# Patient Record
Sex: Male | Born: 1955 | Hispanic: Yes | Marital: Married | Smoking: Never smoker
Health system: Southern US, Community
[De-identification: ages and names within clinical notes are randomized; demographics above are authoritative.]

## PROBLEM LIST (undated history)

## (undated) DIAGNOSIS — C499 Malignant neoplasm of connective and soft tissue, unspecified: Secondary | ICD-10-CM

## (undated) DIAGNOSIS — I10 Essential (primary) hypertension: Secondary | ICD-10-CM

## (undated) DIAGNOSIS — I82409 Acute embolism and thrombosis of unspecified deep veins of unspecified lower extremity: Secondary | ICD-10-CM

## (undated) DIAGNOSIS — R918 Other nonspecific abnormal finding of lung field: Secondary | ICD-10-CM

## (undated) DIAGNOSIS — I2510 Atherosclerotic heart disease of native coronary artery without angina pectoris: Secondary | ICD-10-CM

## (undated) DIAGNOSIS — K8020 Calculus of gallbladder without cholecystitis without obstruction: Secondary | ICD-10-CM

## (undated) DIAGNOSIS — C801 Malignant (primary) neoplasm, unspecified: Secondary | ICD-10-CM

## (undated) DIAGNOSIS — K760 Fatty (change of) liver, not elsewhere classified: Secondary | ICD-10-CM

## (undated) HISTORY — PX: LEG AMPUTATION: SHX1105

## (undated) HISTORY — PX: OTHER SURGICAL HISTORY: SHX169

---

## 2011-02-19 ENCOUNTER — Emergency Department (HOSPITAL_COMMUNITY)
Admission: EM | Admit: 2011-02-19 | Discharge: 2011-02-19 | Disposition: A | Discharge status: Home / Self Care | Payer: Self-pay | Attending: Emergency Medicine | Admitting: Emergency Medicine

## 2011-02-19 ENCOUNTER — Emergency Department (HOSPITAL_COMMUNITY): Payer: Self-pay

## 2011-02-19 DIAGNOSIS — N200 Calculus of kidney: Secondary | ICD-10-CM | POA: Insufficient documentation

## 2011-02-19 DIAGNOSIS — R109 Unspecified abdominal pain: Secondary | ICD-10-CM | POA: Insufficient documentation

## 2011-02-19 DIAGNOSIS — R112 Nausea with vomiting, unspecified: Secondary | ICD-10-CM | POA: Insufficient documentation

## 2011-02-19 LAB — URINE MICROSCOPIC-ADD ON

## 2011-02-19 LAB — URINALYSIS, ROUTINE W REFLEX MICROSCOPIC
Nitrite: NEGATIVE
Specific Gravity, Urine: 1.023 (ref 1.005–1.030)
Urobilinogen, UA: 0.2 mg/dL (ref 0.0–1.0)
pH: 5.5 (ref 5.0–8.0)

## 2013-04-14 ENCOUNTER — Encounter (HOSPITAL_COMMUNITY): Payer: Self-pay | Admitting: Emergency Medicine

## 2013-04-14 ENCOUNTER — Emergency Department (HOSPITAL_COMMUNITY): Payer: Medicaid Other

## 2013-04-14 ENCOUNTER — Inpatient Hospital Stay (HOSPITAL_COMMUNITY)
Admission: EM | Admit: 2013-04-14 | Discharge: 2013-04-15 | DRG: 544 | Disposition: A | Discharge status: Other Acute Inpatient Hospital | Payer: Medicaid Other | Attending: Family Medicine | Admitting: Family Medicine

## 2013-04-14 DIAGNOSIS — I10 Essential (primary) hypertension: Secondary | ICD-10-CM | POA: Diagnosis present

## 2013-04-14 DIAGNOSIS — M25559 Pain in unspecified hip: Secondary | ICD-10-CM | POA: Diagnosis present

## 2013-04-14 DIAGNOSIS — Z809 Family history of malignant neoplasm, unspecified: Secondary | ICD-10-CM

## 2013-04-14 DIAGNOSIS — M79609 Pain in unspecified limb: Secondary | ICD-10-CM | POA: Diagnosis present

## 2013-04-14 DIAGNOSIS — D480 Neoplasm of uncertain behavior of bone and articular cartilage: Principal | ICD-10-CM | POA: Diagnosis present

## 2013-04-14 DIAGNOSIS — M25552 Pain in left hip: Secondary | ICD-10-CM

## 2013-04-14 DIAGNOSIS — R1900 Intra-abdominal and pelvic swelling, mass and lump, unspecified site: Secondary | ICD-10-CM

## 2013-04-14 HISTORY — DX: Essential (primary) hypertension: I10

## 2013-04-14 LAB — COMPREHENSIVE METABOLIC PANEL
AST: 25 U/L (ref 0–37)
Albumin: 3.2 g/dL — ABNORMAL LOW (ref 3.5–5.2)
Calcium: 9.1 mg/dL (ref 8.4–10.5)
Creatinine, Ser: 0.65 mg/dL (ref 0.50–1.35)
Total Protein: 7 g/dL (ref 6.0–8.3)

## 2013-04-14 LAB — CBC
HCT: 40.6 % (ref 39.0–52.0)
MCH: 29.8 pg (ref 26.0–34.0)
MCH: 30 pg (ref 26.0–34.0)
MCHC: 35.2 g/dL (ref 30.0–36.0)
MCV: 85.4 fL (ref 78.0–100.0)
MCV: 85.1 fL (ref 78.0–100.0)
Platelets: 257 10*3/uL (ref 150–400)
RDW: 12.7 % (ref 11.5–15.5)
RDW: 12.7 % (ref 11.5–15.5)
WBC: 7.4 10*3/uL (ref 4.0–10.5)
WBC: 9.1 10*3/uL (ref 4.0–10.5)

## 2013-04-14 LAB — CREATININE, SERUM: GFR calc Af Amer: >90 mL/min (ref >90)

## 2013-04-14 MED ORDER — ACETAMINOPHEN 325 MG PO TABS: 650.0000 mg | ORAL_TABLET | Freq: Four times a day (QID) | ORAL | Status: DC | PRN

## 2013-04-14 MED ORDER — HYDROMORPHONE HCL PF 1 MG/ML IJ SOLN
1.0000 mg | Freq: Once | INTRAMUSCULAR | Status: AC
  Administered 2013-04-14: 1 mg via INTRAMUSCULAR
  Filled 2013-04-14: qty 1

## 2013-04-14 MED ORDER — PROMETHAZINE HCL 25 MG PO TABS: 12.5000 mg | ORAL_TABLET | Freq: Four times a day (QID) | ORAL | Status: DC | PRN

## 2013-04-14 MED ORDER — POLYETHYLENE GLYCOL 3350 17 G PO PACK
17.0000 g | PACK | Freq: Every day | ORAL | Status: DC
  Administered 2013-04-14: 17 g via ORAL
  Filled 2013-04-14 (×2): qty 1

## 2013-04-14 MED ORDER — HYDROMORPHONE HCL PF 1 MG/ML IJ SOLN
1.0000 mg | Freq: Once | INTRAMUSCULAR | Status: AC
  Administered 2013-04-14: 1 mg via INTRAVENOUS
  Filled 2013-04-14: qty 1

## 2013-04-14 MED ORDER — SODIUM CHLORIDE 0.9 % IV SOLN
INTRAVENOUS | Status: DC
  Administered 2013-04-14: 18:00:00 via INTRAVENOUS

## 2013-04-14 MED ORDER — GADOBENATE DIMEGLUMINE 529 MG/ML IV SOLN
15.0000 mL | Freq: Once | INTRAVENOUS | Status: AC
  Administered 2013-04-14: 15 mL via INTRAVENOUS

## 2013-04-14 MED ORDER — PREGABALIN 100 MG PO CAPS
300.0000 mg | ORAL_CAPSULE | Freq: Two times a day (BID) | ORAL | Status: DC
  Administered 2013-04-14 – 2013-04-15 (×3): 300 mg via ORAL
  Filled 2013-04-14 (×3): qty 6

## 2013-04-14 MED ORDER — ONDANSETRON HCL 4 MG/2ML IJ SOLN: 4.0000 mg | Freq: Three times a day (TID) | INTRAMUSCULAR | Status: AC | PRN

## 2013-04-14 MED ORDER — HYDROMORPHONE HCL PF 1 MG/ML IJ SOLN
1.0000 mg | INTRAMUSCULAR | Status: DC | PRN
  Administered 2013-04-14: 1 mg via INTRAVENOUS
  Filled 2013-04-14: qty 1

## 2013-04-14 MED ORDER — MORPHINE SULFATE 2 MG/ML IJ SOLN
2.0000 mg | INTRAMUSCULAR | Status: DC | PRN
  Administered 2013-04-15 (×2): 2 mg via INTRAVENOUS
  Filled 2013-04-14 (×2): qty 1

## 2013-04-14 MED ORDER — HEPARIN SODIUM (PORCINE) 5000 UNIT/ML IJ SOLN
5000.0000 [IU] | Freq: Three times a day (TID) | INTRAMUSCULAR | Status: DC
  Administered 2013-04-14 – 2013-04-15 (×5): 5000 [IU] via SUBCUTANEOUS
  Filled 2013-04-14 (×7): qty 1

## 2013-04-14 MED ORDER — HYDROCHLOROTHIAZIDE 12.5 MG PO CAPS
12.5000 mg | ORAL_CAPSULE | Freq: Every day | ORAL | Status: DC
  Administered 2013-04-14 – 2013-04-15 (×2): 12.5 mg via ORAL
  Filled 2013-04-14 (×3): qty 1

## 2013-04-14 NOTE — ED Provider Notes (Signed)
History     CSN: [id number]  Arrival date & time 04/14/13  0912   First MD Initiated Contact with Patient 04/14/13 [phone]     Chief complaint:  Left hip pain, low back pain  (Consider location/radiation/quality/duration/timing/severity/associated sxs/prior treatment) The history is provided by the patient.  pt c/o pain left low back, left buttock and left hip area for the past 2 months. Gradual onset. Constant. Dull. Mod-severe. Non radiating. No specific exacerbating or alleviating factors. No associated numbness/weakness. No skin changes or redness. Reports fall 4 yrs ago w pelvis injury, states did not seek tx then, and unsure if anything was broken. No hx ddd. No recent injury or fall. No fever or chills.   No past medical history on file.  No past surgical history on file.  No family history on file.  History  Substance Use Topics  . Smoking status: Not on file  . Smokeless tobacco: Not on file  . Alcohol Use: Not on file      Review of Systems  Constitutional: Negative for fever.  HENT: Negative for neck pain.   Eyes: Negative for redness.  Respiratory: Negative for shortness of breath.   Cardiovascular: Negative for chest pain and leg swelling.  Gastrointestinal: Negative for abdominal pain.  Genitourinary: Negative for flank pain.  Musculoskeletal: Negative for back pain.  Skin: Negative for rash.  Neurological: Negative for headaches.  Hematological: Does not bruise/bleed easily.  Psychiatric/Behavioral: Negative for confusion.    Allergies  Review of patient's allergies indicates not on file.  Home Medications  No current outpatient prescriptions on file.  BP 170/91  Pulse 80  Temp(Src) 98.1 F (36.7 C) (Oral)  Resp 20  SpO2 96%  Physical Exam  Nursing note and vitals reviewed. Constitutional: He is oriented to person, place, and time. He appears well-developed and well-nourished. No distress.  HENT:  Mouth/Throat: Oropharynx is clear and moist.   Eyes: Conjunctivae are normal.  Neck: Neck supple. No tracheal deviation present.  Cardiovascular: Normal rate, regular rhythm, normal heart sounds and intact distal pulses.   Pulmonary/Chest: Effort normal and breath sounds normal. No accessory muscle usage. No respiratory distress.  Abdominal: Soft. Bowel sounds are normal. He exhibits no distension.  Musculoskeletal: Normal range of motion. He exhibits no edema and no tenderness.  tls spine non tender, aligned. Left lumbar and left sciatic notch tenderness. Good rom at left hip and knee, w mild pain. No findings of septic joint. Distal pulses palp bil. Large palp firm, non tender, mass left pelvis and inguinal area.   Neurological: He is alert and oriented to person, place, and time.  Steady gait. Motor 5/5 LLE. Straight leg raise neg.  Skin: Skin is warm and dry.  Psychiatric: He has a normal mood and affect.    ED Course  Procedures (including critical care time)   Results for orders placed during the hospital encounter of 04/14/13  CBC      Result Value Range   WBC 7.4  4.0 - 10.5 K/uL   RBC 4.73  4.22 - 5.81 MIL/uL   Hemoglobin 14.1  13.0 - 17.0 g/dL   HCT 56.2  13.0 - 86.5 %   MCV 85.4  78.0 - 100.0 fL   MCH 29.8  26.0 - 34.0 pg   MCHC 34.9  30.0 - 36.0 g/dL   RDW 78.4  69.6 - 29.5 %   Platelets 257  150 - 400 K/uL  COMPREHENSIVE METABOLIC PANEL      Result Value  Range   Sodium 140  135 - 145 mEq/L   Potassium 4.0  3.5 - 5.1 mEq/L   Chloride 107  96 - 112 mEq/L   CO2 25  19 - 32 mEq/L   Glucose, Bld 97  70 - 99 mg/dL   BUN 14  6 - 23 mg/dL   Creatinine, Ser 7.84  0.50 - 1.35 mg/dL   Calcium 9.1  8.4 - 69.6 mg/dL   Total Protein 7.0  6.0 - 8.3 g/dL   Albumin 3.2 (*) 3.5 - 5.2 g/dL   AST 25  0 - 37 U/L   ALT 20  0 - 53 U/L   Alkaline Phosphatase 88  39 - 117 U/L   Total Bilirubin 0.3  0.3 - 1.2 mg/dL   GFR calc non Af Amer >90  >90 mL/min   GFR calc Af Amer >90  >90 mL/min   Dg Lumbar Spine Complete  04/14/2013    *RADIOLOGY REPORT*  Clinical Data: Chronic left hip pain  LUMBAR SPINE - COMPLETE 4+ VIEW  Comparison:  CT scan of 02/19/2011  Findings: Five views of the lumbar spine submitted.  No acute fracture or subluxation.  Alignment, disc spaces and vertebral height are preserved.  Stable chronic compression deformity upper endplate of the T10 vertebral body.  IMPRESSION: No acute fracture or subluxation.  Stable compression deformity of T10 vertebral body.   Original Report Authenticated By: Natasha Mead, M.D.   Dg Hip Complete Left  04/14/2013   *RADIOLOGY REPORT*  Clinical Data: Pain  LEFT HIP - COMPLETE 2+ VIEW  Comparison: None.  Findings: Three views of the left hip submitted.  There is a destructive lytic lesion in the left superior pubic ramus measures at least 3.6 cm.  This is highly suspicious for metastatic disease or myeloma.  Further evaluation with MRI is recommended.  IMPRESSION: Destructive lytic lesion in the superior left pubic ramus suspicious for metastatic disease or myeloma.  Critical findings discussed Dr. Denton Lank   Original Report Authenticated By: Natasha Mead, M.D.   Mr Pelvis W Wo Contrast  04/14/2013   *RADIOLOGY REPORT*  Clinical Data: Left pelvic pain.  Lytic lesion of the superior left pubic ramus on radiographs.  No history of malignancy.  MRI PELVIS WITHOUT AND WITH CONTRAST  Technique:  Multiplanar multisequence MR imaging of the pelvis was performed both before and after administration of intravenous contrast.  Contrast: 15mL MULTIHANCE GADOBENATE DIMEGLUMINE 529 MG/ML IV SOLN  Comparison: Radiographs same date.  Abdominal pelvic CT 02/19/2011.  Findings: There is a very large destructive mass involving the left superior pubic ramus.  This extends into the anterior superior acetabulum and symphysis pubis. No involvement of the left femoral head is identified.  However, there is a small left hip joint effusion with synovial enhancement following contrast.  There is a large associated soft  tissue mass which measures 9.8 x 7.5 cm transverse and 15.7 cm cephalocaudad.  This extends into the groin pain and ischiofemoral space.  This mass is heterogeneous with areas of T2 and T1 hyperintensity.  Postcontrast, there is diffuse heterogeneous enhancement of the soft tissue components.  This mass demonstrates superior extension along the left iliacus muscle. There is adjacent intrapelvic and gluteus muscular edema. No focal fluid collections are identified. The left external iliac and femoral vessels are displaced anterior medially.  Apart from the dominant mass, no other lytic lesions or enhancing soft tissue masses are identified.  There is no associated inguinal lymphadenopathy.  IMPRESSION:  Large  destructive mass involving the left hemi pelvis as described with intra-articular extension into the left hip joint.  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  Less likely considerations would include plasmacytoma and metastatic disease. Tissue sampling recommended.   Original Report Authenticated By: Carey Bullocks, M.D.      MDM  Reviewed nursing notes and prior charts for additional history.   Dilaudid 1 mg im.   Xrays.   Pt with large destructive mass left hip/pelvis.   Mri done.   Recheck pain persists. Dilaudid 1 mg iv.  Pain improved.  Called unassigned service/fpc to admit.  They indicate temp orders to fpc.   Discussed mri w pt.          Suzi Roots, MD 04/14/13 [phone]

## 2013-04-14 NOTE — H&P (Signed)
Family Medicine Teaching Service Admission H&P Service Pager: [phone]  Patient name: Gregory Frazier Medical record number: [id number] Date of birth: Mar 24, 1956 Age: 57 y.o. Gender: male  Primary Care Provider: No PCP Per Patient Attending Physician: Barbaraann Barthel, MD   CC  Hip Pain and "I wasn't able to walk"  HPI  Gregory Frazier is a 57 y.o. year old male presenting with 2 months of hip pain and inability to walk due to pain in his left foot. History obtained with assistance of Spanish language line interpretation. Pain came on gradually and has been getting worse. Pain is in his left anterior hip and goes down his leg into his foot. Pain is constant, sharp, all day, day and night, and has only taken some "pain pills," that he gets from [place] (see below); pt does not have a doctor in the [place]. Pt has noted some loss of weight and has had some decreased appetite. Pt denies fevers. Pt endorses some sweats over the past two months. Denies abdominal pain, chest pain, cough/runny nose, N/V/D, pain with urination.   Pt's son is present and assists with history. Pt has a family history of cancer; mother died from cancer but he does not know what type. Pt lives in [place] with son. Denies tobacco or EtOH use. Denies illicit drug use. Pt has not worked for several months, but previously worked in [employer].  ROS   As above in HPI. Otherwise, 12-system ROS reviewed and all negative.  HISTORY  PMHx:  Past Medical History  Diagnosis Date  . Hypertension     PSHx: History reviewed. No pertinent past surgical history.  Social Hx: History   Social History  . Marital Status: Married    Spouse Name: N/A    Number of Children: N/A  . Years of Education: N/A   Social History Main Topics  . Smoking status: None  . Smokeless tobacco: None  . Alcohol Use: None  . Drug Use: None  . Sexually Active: None   Other Topics Concern  . None   Social History Narrative  . None     Family Hx: Mother had cancer, but patient does not know any specific details.  Allergies: No Known Allergies  Home Medications: Prescriptions prior to admission  Medication Sig Dispense Refill  . pregabalin (LYRICA) 150 MG capsule Take 300 mg by mouth 2 (two) times daily.      Marland Kitchen PRESCRIPTION MEDICATION Take 1 tablet by mouth 2 (two) times daily.        OBJECTIVE  Vitals: Temp:  [97.6 F (36.4 C)-99 F (37.2 C)] 99 F (37.2 C) (06/04 1705) Pulse Rate:  [80-92] 91 (06/04 1705) Resp:  [15-20] 15 (06/04 1705) BP: (160-181)/(91-107) 164/98 mmHg (06/04 1705) SpO2:  [94 %-99 %] 95 % (06/04 1705) Weight:  [141 lb 1.5 oz (64 kg)] 141 lb 1.5 oz (64 kg) (06/04 1705)  Weight: Wt Readings from Last 3 Encounters:  04/14/13 141 lb 1.5 oz (64 kg)    PE: Physical Exam  Constitutional: He is well-developed, well-nourished, and in no distress.  HENT:  Head: Normocephalic and atraumatic.  Mouth/Throat: Oropharynx is clear and moist.  Cardiovascular: Normal rate.   Pulmonary/Chest: Effort normal and breath sounds normal. He has no wheezes. He has no rales.  Abdominal: Bowel sounds are normal. There is no tenderness. There is no rebound and no guarding.  Mild distension, but not rigid  Musculoskeletal: Normal range of motion. He exhibits no edema.  Neurological:  Grossly normal  with 5/5 strength in UE and LE, normal sensation  Skin:  Palpable mass and fullness left groin anterior, roughly size of tennis ball, with mild erythema but no abscess   Psychiatric: Affect normal.    LABS: CBC BMET   Recent Labs Lab 04/14/13 1013 04/14/13 1716  WBC 7.4 9.1  HGB 14.1 14.3  HCT 40.4 40.6  PLT 257 251    Recent Labs Lab 04/14/13 1013 04/14/13 1716  NA 140  --   K 4.0  --   CL 107  --   CO2 25  --   BUN 14  --   CREATININE 0.65 0.66  GLUCOSE 97  --   CALCIUM 9.1  --      IMAGING: Dg Lumbar Spine Complete  04/14/2013   *RADIOLOGY REPORT*  Clinical Data: Chronic left hip  pain  LUMBAR SPINE - COMPLETE 4+ VIEW  Comparison:  CT scan of 02/19/2011  Findings: Five views of the lumbar spine submitted.  No acute fracture or subluxation.  Alignment, disc spaces and vertebral height are preserved.  Stable chronic compression deformity upper endplate of the T10 vertebral body.  IMPRESSION: No acute fracture or subluxation.  Stable compression deformity of T10 vertebral body.   Original Report Authenticated By: Natasha Mead, M.D.   Dg Hip Complete Left  04/14/2013   *RADIOLOGY REPORT*  Clinical Data: Pain  LEFT HIP - COMPLETE 2+ VIEW  Comparison: None.  Findings: Three views of the left hip submitted.  There is a destructive lytic lesion in the left superior pubic ramus measures at least 3.6 cm.  This is highly suspicious for metastatic disease or myeloma.  Further evaluation with MRI is recommended.  IMPRESSION: Destructive lytic lesion in the superior left pubic ramus suspicious for metastatic disease or myeloma.  Critical findings discussed Dr. Denton Lank   Original Report Authenticated By: Natasha Mead, M.D.   Mr Pelvis W Wo Contrast  04/14/2013   *RADIOLOGY REPORT*  Clinical Data: Left pelvic pain.  Lytic lesion of the superior left pubic ramus on radiographs.  No history of malignancy.  MRI PELVIS WITHOUT AND WITH CONTRAST  Technique:  Multiplanar multisequence MR imaging of the pelvis was performed both before and after administration of intravenous contrast.  Contrast: 15mL MULTIHANCE GADOBENATE DIMEGLUMINE 529 MG/ML IV SOLN  Comparison: Radiographs same date.  Abdominal pelvic CT 02/19/2011.  Findings: There is a very large destructive mass involving the left superior pubic ramus.  This extends into the anterior superior acetabulum and symphysis pubis. No involvement of the left femoral head is identified.  However, there is a small left hip joint effusion with synovial enhancement following contrast.  There is a large associated soft tissue mass which measures 9.8 x 7.5 cm transverse and  15.7 cm cephalocaudad.  This extends into the groin pain and ischiofemoral space.  This mass is heterogeneous with areas of T2 and T1 hyperintensity.  Postcontrast, there is diffuse heterogeneous enhancement of the soft tissue components.  This mass demonstrates superior extension along the left iliacus muscle. There is adjacent intrapelvic and gluteus muscular edema. No focal fluid collections are identified. The left external iliac and femoral vessels are displaced anterior medially.  Apart from the dominant mass, no other lytic lesions or enhancing soft tissue masses are identified.  There is no associated inguinal lymphadenopathy.  IMPRESSION:  Large destructive mass involving the left hemi pelvis as described with intra-articular extension into the left hip joint.  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  Less  likely considerations would include plasmacytoma and metastatic disease. Tissue sampling recommended.   Original Report Authenticated By: Carey Bullocks, M.D.    Medications:   . sodium chloride 20 mL/hr at 04/14/13 1809   . heparin  5,000 Units Subcutaneous Q8H  . polyethylene glycol  17 g Oral Daily  . pregabalin  300 mg Oral BID   HYDROmorphone (DILAUDID) injection, ondansetron (ZOFRAN) IV, promethazine  Assessment & Plan  LOS: [age] y.o. year old male with 2 month hx of LT hip and LE pain.  Today, he was unable to walk due to pain and was brought to ED.  #  LT hip pain: Imaging of LT hip concerning for costochondroma.  - Pain control with Morphine 2mg  Q4 PRN and Tylenol PRN             - Will consult Ortho and IR in AM to discuss tissue biopsy             - Once biopsy is done, will consult Oncology             - Discussed results of MRI with patient and son who expressed understanding  # Hypertension: BP elevated today.  He does admit to hx of HTN, but does not take medication.  - Will start HCTZ 12.5 mg now and adjust as needed             - Will try to  keep patient comfortable as pain may be contributing to elevated BP  --- FEN: SLIV   Heart Healthy diet, then NPO at MN  --- PPx: Heparin SQ    Disposition   Pending clinical improvement and further work up.   Bobbye Morton, MD [facility] Resident - PGY-1  04/14/2013 6:26 PM  Patient seen, examined with Dr. Casper Harrison.  Available data reviewed. Agree with findings, assessment, and plan as outlined above.  Tatum Cor[place], DO PGY-3 04/14/2013 9:14 PM    FMTS Attending Admit Note Patient seen and examined by me, interview conducted in Spanish with patient.  Discussed with resident team.  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  He denies fevers/chills, weight changes or anorexia over that time period, denies other pain or discomfort.  States he has been managing the pain with otc pain relievers but could not recall their names.  Denies smoking or alcohol use.   Findings of MRI reviewed; showing mass lesion along left iliacus muscle and destructive lesions along the left hemipelvis.  Plan for tissue sampling via IR vs Ortho consult before requesting Oncology involvement.  He reports that his pain control is adequate with dilaudid.  The findings of the MRI were discussed with pt and family by me. Paula Compton, MD

## 2013-04-14 NOTE — ED Notes (Signed)
Patient transported to MRI 

## 2013-04-14 NOTE — ED Notes (Signed)
Patient said he fell from a roof of a house four years ago and he never went to get it checked out.  He got some "injections" from [place] and rested for a week and returned to work without any problems for years until a month and a half ago.  He started having pain in his hip that radiated to his leg and groin.  The patient denies incontinence and numbness.  He says he has been hurting for about two months and started developing swelling about one month and a half ago.  The patient's pain is a 10/10.

## 2013-04-14 NOTE — ED Notes (Signed)
Family at bedside. 

## 2013-04-14 NOTE — ED Notes (Signed)
Patient transported to X-ray 

## 2013-04-15 DIAGNOSIS — I10 Essential (primary) hypertension: Secondary | ICD-10-CM

## 2013-04-15 DIAGNOSIS — M25559 Pain in unspecified hip: Secondary | ICD-10-CM

## 2013-04-15 DIAGNOSIS — R1900 Intra-abdominal and pelvic swelling, mass and lump, unspecified site: Secondary | ICD-10-CM

## 2013-04-15 LAB — BASIC METABOLIC PANEL
BUN: 13 mg/dL (ref 6–23)
Chloride: 101 mEq/L (ref 96–112)
GFR calc Af Amer: >90 mL/min (ref >90)
Potassium: 4 mEq/L (ref 3.5–5.1)

## 2013-04-15 LAB — GLUCOSE, CAPILLARY: Glucose-Capillary: 106 mg/dL — ABNORMAL HIGH (ref 70–99)

## 2013-04-15 LAB — CBC
HCT: 40.1 % (ref 39.0–52.0)
Platelets: 263 10*3/uL (ref 150–400)
RBC: 4.66 MIL/uL (ref 4.22–5.81)
RDW: 12.9 % (ref 11.5–15.5)
WBC: 8.4 10*3/uL (ref 4.0–10.5)

## 2013-04-15 LAB — APTT: aPTT: 34 seconds (ref 24–37)

## 2013-04-15 MED ORDER — HEPARIN SODIUM (PORCINE) 5000 UNIT/ML IJ SOLN: 5000.0000 [IU] | Freq: Three times a day (TID) | INTRAMUSCULAR | Status: DC

## 2013-04-15 MED ORDER — PROMETHAZINE HCL 12.5 MG PO TABS: 12.5000 mg | ORAL_TABLET | Freq: Four times a day (QID) | ORAL | Status: DC | PRN

## 2013-04-15 MED ORDER — MORPHINE SULFATE 2 MG/ML IJ SOLN: 2.0000 mg | INTRAMUSCULAR | Status: DC | PRN

## 2013-04-15 MED ORDER — ONDANSETRON HCL 4 MG/2ML IJ SOLN: 4.0000 mg | Freq: Three times a day (TID) | INTRAMUSCULAR | Status: DC | PRN

## 2013-04-15 MED ORDER — HYDROCHLOROTHIAZIDE 12.5 MG PO CAPS: 12.5000 mg | ORAL_CAPSULE | Freq: Every day | ORAL | Status: DC

## 2013-04-15 MED ORDER — POLYETHYLENE GLYCOL 3350 17 G PO PACK: 17.0000 g | PACK | Freq: Every day | ORAL | Status: DC

## 2013-04-15 MED ORDER — ACETAMINOPHEN 325 MG PO TABS: 650.0000 mg | ORAL_TABLET | Freq: Four times a day (QID) | ORAL | Status: DC | PRN

## 2013-04-15 NOTE — Progress Notes (Signed)
Utilization review completed. Noretta Frier, RN, BSN. 

## 2013-04-15 NOTE — Progress Notes (Signed)
FMTS Daily Intern Progress Note  Subjective: NPO since midnight, hungry now. Reports 5/10 hip pain, worsened with ambulation more than with palpation. Denies SOB.   I have reviewed the patient's medications. PRN dilaudid 6pm.  Objective Temp:  [97.6 F (36.4 C)-99 F (37.2 C)] 98.4 F (36.9 C) (06/05 0500) Pulse Rate:  [80-92] 86 (06/05 0500) Resp:  [15-20] 18 (06/05 0500) BP: (122-181)/(64-107) 122/64 mmHg (06/05 0500) SpO2:  [94 %-99 %] 99 % (06/05 0500) Weight:  [141 lb 1.5 oz (64 kg)] 141 lb 1.5 oz (64 kg) (06/04 1705)  No intake or output data in the 24 hours ending 04/15/13 0832  CBG (last 3)   Recent Labs  04/15/13 0739  GLUCAP 106*    General: NAD, asleep, wakes appropriately HEENT: AT/[place], MMM, EOMI, sclera clear Pulm: Normal effort Ext: Left hip no obvious deformity, ROM limited with tenderness on external rotation, mild tenderness with palpation of lateral hip and medial hip joint, no mass palpated Neuro: Awake, alert, no focal deficit, normal speech  Labs and Imaging  Recent Labs Lab 04/14/13 1013 04/14/13 1716 04/15/13 0515  WBC 7.4 9.1 8.4  HGB 14.1 14.3 14.1  HCT 40.4 40.6 40.1  PLT 257 251 263     Recent Labs Lab 04/14/13 1013 04/14/13 1716 04/15/13 0515  NA 140  --  139  K 4.0  --  4.0  CL 107  --  101  CO2 25  --  27  BUN 14  --  13  CREATININE 0.65 0.66 0.76  GLUCOSE 97  --  99  CALCIUM 9.1  --  9.7   6/4 MRI pelvis w/wo contrast: IMPRESSION:  Large destructive mass involving the left hemi pelvis as described  with intra-articular extension into the left hip joint. 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 Less likely  considerations would include plasmacytoma and metastatic disease.  Tissue sampling recommended.  Original Report Authenticated By: Carey Bullocks, M.D.   Assessment and Plan LOS: 57 57 y.o. year old male with 2 month hx of LT hip and LE pain. Today, he was unable to walk due to pain and was  brought to ED.   # LT hip pain: Imaging of LT hip concerning for costochondroma.  - Pain control with Morphine 2mg  Q4 PRN and Tylenol PRN;Add PRN NSAID like toradol with bony malignancy in ddx; helpful for additional pain control. - F/u orthopedic surgery recs; if biopsy here recommended, will c/s IR; if biopsy at tertiary center recommended, will transfer - Discussed results of MRI with patient and son and other family members who expressed understanding   # Hypertension: BP elevated on admission. He does admit to hx of HTN, but does not take medication.  - On admission, started HCTZ 12.5 mg and will adjust as needed, currently stable  - Will try to keep patient comfortable as pain may be contributing to elevated BP   # FEN: SLIV  - NPO since midnight last night; if biopsy not here, will allow to eat  # PPx: Heparin SQ  Dispo: Pending clinical improvement and further work up; if workup indicated at tertiary center per orthopedic surgery, will transfer to [facility], [place], or [place].  Code status: Not discussed; full code for now.   Simone Curia MD Family Practice Service Pager: [phone] Text pages welcome.  Can page through [email or web address], [place] [facility] 04/15/2013, 8:32 AM

## 2013-04-15 NOTE — Progress Notes (Signed)
Family Medicine Teaching Service Attending Note  I discussed patient Gregory Frazier  with Dr. Benjamin Stain and reviewed their note for today.  I agree with their assessment and plan.

## 2013-04-15 NOTE — Discharge Summary (Signed)
Discharge Summary 04/15/2013 8:17 PM  Gregory Frazier DOB: Feb 11, 1956 MRN: [id number]  Date of Admission: 04/14/2013 Date of Discharge: 04/15/2013   PCP: No PCP Per Patient Consultants: None  Reason for Admission: Severe left hip and lower extremity pain preventing ambulation  Discharge Diagnosis Primary 1. Left pelvic mass Secondary 1. Hypertension  Hospital Course: 57 y.o. year old male with 2 month hx of LT hip and LE pain. Today, he was unable to walk due to pain and was brought to ED.   1. 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 Vital signs and CBC were non-concerning for infective process, and mass appeared non-fluctuant. Patient received dilaudid in the ED for pain and was continued on morphine 2mg  q4prn and tylenol prn. May benefit from adding NSAID as well with MSK related pain. Called orthopedic surgery who recommended this be worked up in a tertiary care center due to size and quality that is presumed of this mass, which would likely require resources that [facility] could not provide to the patient. Family is Spanish-only speaking - discussed in Spanish that this could be a malignancy and that patient is getting transferred to [facility]. Patient voiced understanding. Patient will likely need biopsy and orthopedic surgery consultation. Will transfer to medicine service in the likely event that this is a malignancy, to be able to get oncology on board in the future. Transferring with discharge summary and CD of imaging done.  2. Hypertension: BP was elevated on admission, and patient admitted at that time to history of HTN but does not take medicine. He does not have a PCP. Started HCTZ 12.5mg  on admission with plan to adjust as needed, but BP currently stable 110/80 at and creatinine WNL. Also addressed pain control to keep BP stable.    Discharge day physical exam: Filed Vitals:   04/15/13 1936   BP: 140/75  Pulse: 90  Temp: 98.1 F (36.7 C)  Resp: 18  General: NAD, asleep, wakes appropriately  HEENT: AT/Sarah Ann, MMM, EOMI, sclera clear  Pulm: Normal effort  Ext: Left hip no obvious deformity, ROM limited with tenderness on external rotation, mild tenderness with palpation of lateral hip and medial hip joint, no mass palpated  Neuro: Awake, alert, no focal deficit, normal speech   Procedures: None  Discharge Medications   Medication List    ASK your doctor about these medications       pregabalin 150 MG capsule  Commonly known as:  LYRICA  Take 300 mg by mouth 2 (two) times daily.     PRESCRIPTION MEDICATION  Take 1 tablet by mouth 2 (two) times daily.        Pertinent Hospital Labs Labs and Imaging   Recent Labs  Lab  04/14/13 1013  04/14/13 1716  04/15/13 0515   WBC  7.4  9.1  8.4   HGB  14.1  14.3  14.1   HCT  40.4  40.6  40.1   PLT  257  251  263     Recent Labs  Lab  04/14/13 1013  04/14/13 1716  04/15/13 0515   NA  140  --  139   K  4.0  --  4.0   CL  107  --  101   CO2  25  --  27   BUN  14  --  13   CREATININE  0.65  0.66  0.76   GLUCOSE  97  --  99   CALCIUM  9.1  --  9.7    6/4 Left hip 2+ view: IMPRESSION:  Destructive lytic lesion in the superior left pubic ramus  suspicious for metastatic disease or myeloma.  Critical findings discussed Dr. Denton Lank  6/4 DG Lumbar spine complete: IMPRESSION:  No acute fracture or subluxation. Stable compression deformity of  T10 vertebral body.  6/4 MRI pelvis w/wo contrast: IMPRESSION:  Large destructive mass involving the left hemi pelvis as described  with intra-articular extension into the left hip joint. 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 Less likely  considerations would include plasmacytoma and metastatic disease.  Tissue sampling recommended.  Original Report Authenticated By: Carey Bullocks, M.D.   Discharge instructions: Transferring care to inpatient team,  see hospital course and f/u issues below.  Condition at discharge: stable  Disposition: Transfer to [facility] FM/IM service  Pending Tests: None  Follow up: - Pending management at tertiary care center. - Will need new PCP, possibly oncologist if this is a malignancy.   Follow up Issues:  - HTN control on HCTZ 12.5mg  daily - Management of left hip/pelvic mass - biopsy likely needed, orthopedic surgery recommendations, possible oncology consult if biopsy reveals malignancy - Pain management    Simone Curia 04/15/2013 8:17 PM PGY-1 Family Medicine Teaching Service Service Pager: [phone] Text pages welcome.  Can page through [email or web address], [facility] [facility].

## 2013-06-15 ENCOUNTER — Emergency Department (HOSPITAL_COMMUNITY): Payer: Medicaid Other

## 2013-06-15 ENCOUNTER — Encounter (HOSPITAL_COMMUNITY): Payer: Self-pay | Admitting: *Deleted

## 2013-06-15 ENCOUNTER — Emergency Department (HOSPITAL_COMMUNITY)
Admission: EM | Admit: 2013-06-15 | Discharge: 2013-06-15 | Disposition: A | Discharge status: Other Acute Inpatient Hospital | Payer: Medicaid Other | Attending: Emergency Medicine | Admitting: Emergency Medicine

## 2013-06-15 DIAGNOSIS — H539 Unspecified visual disturbance: Secondary | ICD-10-CM

## 2013-06-15 DIAGNOSIS — R112 Nausea with vomiting, unspecified: Secondary | ICD-10-CM | POA: Insufficient documentation

## 2013-06-15 DIAGNOSIS — C4020 Malignant neoplasm of long bones of unspecified lower limb: Secondary | ICD-10-CM | POA: Insufficient documentation

## 2013-06-15 DIAGNOSIS — C419 Malignant neoplasm of bone and articular cartilage, unspecified: Secondary | ICD-10-CM

## 2013-06-15 DIAGNOSIS — I10 Essential (primary) hypertension: Secondary | ICD-10-CM | POA: Insufficient documentation

## 2013-06-15 DIAGNOSIS — Z79899 Other long term (current) drug therapy: Secondary | ICD-10-CM | POA: Insufficient documentation

## 2013-06-15 DIAGNOSIS — H532 Diplopia: Secondary | ICD-10-CM | POA: Insufficient documentation

## 2013-06-15 DIAGNOSIS — R5381 Other malaise: Secondary | ICD-10-CM | POA: Insufficient documentation

## 2013-06-15 DIAGNOSIS — K117 Disturbances of salivary secretion: Secondary | ICD-10-CM | POA: Insufficient documentation

## 2013-06-15 DIAGNOSIS — R531 Weakness: Secondary | ICD-10-CM

## 2013-06-15 HISTORY — DX: Malignant (primary) neoplasm, unspecified: C80.1

## 2013-06-15 LAB — CBC WITH DIFFERENTIAL/PLATELET
Basophils Absolute: 0 10*3/uL (ref 0.0–0.1)
Basophils Relative: 0 % (ref 0–1)
Hemoglobin: 11.1 g/dL — ABNORMAL LOW (ref 13.0–17.0)
MCHC: 35.7 g/dL (ref 30.0–36.0)
Monocytes Relative: 3 % (ref 3–12)
Neutro Abs: 11.6 10*3/uL — ABNORMAL HIGH (ref 1.7–7.7)
Neutrophils Relative %: 90 % — ABNORMAL HIGH (ref 43–77)
RDW: 14.6 % (ref 11.5–15.5)
WBC: 12.9 10*3/uL — ABNORMAL HIGH (ref 4.0–10.5)

## 2013-06-15 LAB — COMPREHENSIVE METABOLIC PANEL
AST: 16 U/L (ref 0–37)
Albumin: 2.8 g/dL — ABNORMAL LOW (ref 3.5–5.2)
Alkaline Phosphatase: 81 U/L (ref 39–117)
Chloride: 101 mEq/L (ref 96–112)
Potassium: 3.5 mEq/L (ref 3.5–5.1)
Total Bilirubin: 0.2 mg/dL — ABNORMAL LOW (ref 0.3–1.2)

## 2013-06-15 NOTE — ED Provider Notes (Addendum)
I saw and evaluated the patient, reviewed the resident's note and I agree with the findings and plan.  Plan for t/s to [facility] for further care as [facility] is following him.  Pt stable throughout ER stay.   Darlys Gales, MD 06/15/13 2300  Darlys Gales, MD 06/15/13 [phone]

## 2013-06-15 NOTE — ED Notes (Signed)
Pt has returned from being out of the department; pt placed back on monitor, continuous pulse oximetry and blood pressure cuff; family at bedside 

## 2013-06-15 NOTE — ED Notes (Signed)
Pt is here with increased saliva since last week and double vision since yesterday.  Pt has no facial droop, arm drift, and grips are weak but equal.  Pt follows commands.  Pt is receiving chemo for bone cancer.

## 2013-06-15 NOTE — ED Notes (Signed)
2110  Pt waiting to be transferred to [facility].  No needs at this time

## 2013-06-15 NOTE — ED Notes (Signed)
MD at bedside. 

## 2013-06-15 NOTE — ED Notes (Signed)
Pt brought back to room from triage via wheelchair with family in tow; pt undressed, in gown, on monitor, continuous pulse oximetry and blood pressure cuff; sheet provided

## 2013-06-15 NOTE — ED Provider Notes (Signed)
CSN: [id number]     Arrival date & time 06/15/13  1602 History     First MD Initiated Contact with Patient 06/15/13 1646     No chief complaint on file.  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  Her daughter is translating and reports that patient had a new type of chemotherapy last week.  Patient reports double vision began yesterday and is present when he uses both eyes at once. On talking with the oncologist on call at [facility], Dr. Hilary Hertz, it was found that patient had had doxorubicin in, dexamethasone, and cisplatin. The physician on call believes that these medications could be responsible for mucositis causing increased salivation, however would not expect to see double vision. Patient denies any other symptoms, including no headache, difficulty swallowing, change in speech, facial asymmetry, numbness, weakness, or vertigo.  Patient does note nausea, which is typical post chemotherapy, however notes vomiting which is new since this last round of chemotherapy. Patient began a new medication for her nausea, however they do not remember what it is called. On review of his medications from [facility] it appears he takes Ativan morphine, Zofran, Compazine.  Patient and family deny any other known environmental exposures including no exposures to pesticides.   Past Medical History  Diagnosis Date  . Hypertension   . Cancer     BONE CA-- CHEMO   History reviewed. No pertinent past surgical history. No family history on file. History  Substance Use Topics  . Smoking status: Never Smoker   . Smokeless tobacco: Never Used  . Alcohol Use: No    Review of Systems  Constitutional: Negative for fever and chills.  HENT: Positive for drooling. Negative  for sore throat and neck stiffness.   Eyes: Positive for visual disturbance. Negative for photophobia and redness.  Respiratory: Negative for shortness of breath.   Cardiovascular: Negative for chest pain.  Gastrointestinal: Positive for nausea and vomiting. Negative for abdominal pain, diarrhea and constipation.  Genitourinary: Negative for difficulty urinating.  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 Negative for back pain.  Skin: Negative for rash.  Neurological: Negative for tremors, syncope, facial asymmetry, speech difficulty, weakness, light-headedness and headaches.    Allergies  Review of patient's allergies indicates no known allergies.  Home Medications   Current Outpatient Rx  Name  Route  Sig  Dispense  Refill  . acetaminophen (TYLENOL) 325 MG tablet   Oral   Take 2 tablets (650 mg total) by mouth every 6 (six) hours as needed.         . heparin 5000 UNIT/ML injection   Subcutaneous   Inject 1 mL (5,000 Units total) into the skin every 8 (eight) hours.   1 mL      . hydrochlorothiazide (MICROZIDE) 12.5 MG capsule   Oral   Take 1 capsule (12.5 mg total) by mouth daily.         Marland Kitchen morphine 2 MG/ML injection   Intravenous   Inject 1 mL (2 mg total) into the vein every 4 (four) hours as needed.   1 mL   0   . ondansetron (ZOFRAN) 4 MG/2ML SOLN injection   Intravenous   Inject 2 mLs (4 mg total) into the vein every 8 (eight) hours as needed for nausea or vomiting.   2 mL   0   .  polyethylene glycol (MIRALAX / GLYCOLAX) packet   Oral   Take 17 g by mouth daily.   14 each   0   . pregabalin (LYRICA) 150 MG capsule   Oral   Take 300 mg by mouth 2 (two) times daily.         Marland Kitchen PRESCRIPTION MEDICATION   Oral   Take 1 tablet by mouth 2 (two) times daily.         . promethazine (PHENERGAN) 12.5 MG tablet   Oral   Take 1 tablet (12.5 mg total) by mouth every 6 (six) hours as needed for nausea.   30  tablet   0    BP 103/68  Pulse 110  Temp(Src) 98.3 F (36.8 C) (Oral)  Resp 16  SpO2 100% Physical Exam  Nursing note and vitals reviewed. Constitutional: He is oriented to person, place, and time. He appears well-developed and well-nourished. He appears ill. No distress.  HENT:  Head: Normocephalic and atraumatic.  Eyes: Conjunctivae and EOM are normal. Pupils are equal, round, and reactive to light.  Neck: Normal range of motion.  Cardiovascular: Normal rate, regular rhythm, normal heart sounds and intact distal pulses.  Exam reveals no gallop and no friction rub.   No murmur heard. Pulmonary/Chest: Effort normal and breath sounds normal. No respiratory distress. He has no wheezes. He has no rales.  Port in place  Abdominal: Soft. He exhibits no distension. There is no tenderness. There is no guarding.  Musculoskeletal: He exhibits no edema.  Neurological: He is alert and oriented to person, place, and time. He has normal strength. No cranial nerve deficit or sensory deficit. Coordination normal. Abnormal gait: patient with sarcoma leg, unable to normally ambulate. GCS eye subscore is 4. GCS verbal subscore is 5. GCS motor subscore is 6.  Skin: Skin is warm and dry. No rash noted. He is not diaphoretic.   IMPRESSION: 1. Multiple potential lesions in the brain and brain stem, including the left frontal lobe, posterior right temporal and left brain stem (pons and medulla). Given the calcification of the left frontal lesion, the possibility of neurocysticercosis warrants consideration. Alternatively, given the pelvic mass, the possibility of metastatic disease should also be considered. Additionally, areas of acute/subacute ischemia could result in the findings in the right temporal lobe and left brainstem. Further evaluation with brain MRI with without IV gadolinium is recommended to better characterize these findings.   ED Course   Procedures (including critical care  time)  Labs Reviewed - No data to display No results found. No diagnosis found.  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  Patient without other cholinergic symptoms or signs on exam and without medications to indicate this as cause of excess salivation or double vision.  Oncology at [facility] consulted given concern this may be side effect to chemotherapy, however discovered patient had received doxorubicin, cisplatin and these are unlikely to cause these symptoms.  CT Head done given concern of possible stroke or other lesion causing double vision and showed suspicion lesions in brain stem and scattered through brain that may represent neurocysticercosis or metastatic lesions.  Discussed these findings with Dr. Hilary Hertz of [facility] and it appears patient has never had head imaging.  Discussed options with Dr. Hilary Hertz who recommended transfer of patient to [facility] for MR Brain with contrast and further work up and treatment given their relationship with patient.  Patient with mild leukocytosis, however afebrile and without other infectious symptoms.  Patient transferred to [facility] in stable condition with CT head results.  Care discussed with attending Dr. Redgie Grayer.     Rhae Lerner, MD 06/16/13 [phone]

## 2013-06-17 MED FILL — Ondansetron HCl Inj 4 MG/2ML (2 MG/ML): INTRAMUSCULAR | Qty: 2 | Status: AC

## 2013-07-29 ENCOUNTER — Encounter (HOSPITAL_COMMUNITY): Payer: Self-pay | Admitting: *Deleted

## 2013-07-29 ENCOUNTER — Emergency Department (HOSPITAL_COMMUNITY): Payer: Medicaid Other

## 2013-07-29 ENCOUNTER — Emergency Department (HOSPITAL_COMMUNITY)
Admission: EM | Admit: 2013-07-29 | Discharge: 2013-07-29 | Disposition: A | Discharge status: Home / Self Care | Payer: Medicaid Other | Attending: Emergency Medicine | Admitting: Emergency Medicine

## 2013-07-29 DIAGNOSIS — I82629 Acute embolism and thrombosis of deep veins of unspecified upper extremity: Secondary | ICD-10-CM | POA: Insufficient documentation

## 2013-07-29 DIAGNOSIS — C499 Malignant neoplasm of connective and soft tissue, unspecified: Secondary | ICD-10-CM

## 2013-07-29 DIAGNOSIS — I82621 Acute embolism and thrombosis of deep veins of right upper extremity: Secondary | ICD-10-CM

## 2013-07-29 DIAGNOSIS — M7989 Other specified soft tissue disorders: Secondary | ICD-10-CM

## 2013-07-29 DIAGNOSIS — Z79899 Other long term (current) drug therapy: Secondary | ICD-10-CM | POA: Insufficient documentation

## 2013-07-29 DIAGNOSIS — R609 Edema, unspecified: Secondary | ICD-10-CM | POA: Insufficient documentation

## 2013-07-29 DIAGNOSIS — I10 Essential (primary) hypertension: Secondary | ICD-10-CM | POA: Insufficient documentation

## 2013-07-29 MED ORDER — RIVAROXABAN 15 MG PO TABS
15.0000 mg | ORAL_TABLET | Freq: Once | ORAL | Status: AC
  Administered 2013-07-29: 15 mg via ORAL
  Filled 2013-07-29: qty 1

## 2013-07-29 MED ORDER — RIVAROXABAN 15 MG PO TABS: 15.0000 mg | ORAL_TABLET | Freq: Two times a day (BID) | ORAL | Status: DC

## 2013-07-29 NOTE — ED Notes (Signed)
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 He speaks little english but does have a granddaughter with him that is fluent in [place]. Pt is from home with family uses crutches to walk. Pt has right upper chest porta cath. Pt is alert and oriented, vss per ems 119/80, hr 100, 16, 99% RA. No meds were given. Pt has allergy to pork.

## 2013-07-29 NOTE — Progress Notes (Signed)
Patient's grand daughter at bedside.  Patient does not speak [place].  As per patient's grand daughter, patient does not have a pcp.  EDCM provided patients grandaughter a list of pcp's who accept Medicaid patients in [place].  Patient's grandaughter thankful for resources.  No further needs at this time.

## 2013-07-29 NOTE — ED Notes (Signed)
Bed: HY86 Expected date:  Expected time:  Means of arrival:  Comments: EMS-cancer pt-LE edema

## 2013-07-29 NOTE — Progress Notes (Signed)
*  PRELIMINARY RESULTS* Vascular Ultrasound Right upper extremity venous duplex has been completed.  Preliminary findings: Positive for DVT involving the right proximal IJV and subclavian vein. Rouleaux flow noted in the brachial veins.    Farrel Demark, RDMS, RVT  07/29/2013, 3:26 PM

## 2013-07-29 NOTE — ED Provider Notes (Addendum)
CSN: [id number]     Arrival date & time 07/29/13  1208 History   First MD Initiated Contact with Patient 07/29/13 1223     Chief Complaint  Patient presents with  . Arm Swelling  . Cancer   (Consider location/radiation/quality/duration/timing/severity/associated sxs/prior Treatment) HPI Comments: 57 yo male with sarcoma, on chemo currently, seen at [facility] Dr Gregory Frazier presents with right arm swelling since this am, mild discomfort with moving, no injury/ fevers or hx of similar.  No known dvt hx.  No recent surgery.  Nothing improves . Pt has right chest port-cath.  Pt feels well otherwise, no sob or cp.    The history is provided by the patient and a relative. The history is limited by a language barrier.    Past Medical History  Diagnosis Date  . Hypertension   . Cancer     BONE CA-- CHEMO   History reviewed. No pertinent past surgical history. History reviewed. No pertinent family history. History  Substance Use Topics  . Smoking status: Never Smoker   . Smokeless tobacco: Never Used  . Alcohol Use: No    Review of Systems  Constitutional: Negative for fever and chills.  HENT: Negative for neck pain and neck stiffness.   Eyes: Negative for visual disturbance.  Respiratory: Negative for shortness of breath.   Cardiovascular: Negative for chest pain.  Gastrointestinal: Negative for vomiting and abdominal pain.  Genitourinary: Negative for dysuria and flank pain.  Musculoskeletal: Negative for back pain.  Skin: Negative for rash.  Neurological: Negative for light-headedness and headaches.    Allergies  Pork-derived products  Home Medications   Current Outpatient Rx  Name  Route  Sig  Dispense  Refill  . acetaminophen (TYLENOL) 325 MG tablet   Oral   Take 2 tablets (650 mg total) by mouth every 6 (six) hours as needed.         . heparin 5000 UNIT/ML injection   Subcutaneous   Inject 1 mL (5,000 Units total) into the skin every 8 (eight) hours.   1 mL       . hydrochlorothiazide (MICROZIDE) 12.5 MG capsule   Oral   Take 1 capsule (12.5 mg total) by mouth daily.         Marland Kitchen morphine 2 MG/ML injection   Intravenous   Inject 1 mL (2 mg total) into the vein every 4 (four) hours as needed.   1 mL   0   . ondansetron (ZOFRAN) 4 MG/2ML SOLN injection   Intravenous   Inject 2 mLs (4 mg total) into the vein every 8 (eight) hours as needed for nausea or vomiting.   2 mL   0   . polyethylene glycol (MIRALAX / GLYCOLAX) packet   Oral   Take 17 g by mouth daily.   14 each   0   . pregabalin (LYRICA) 150 MG capsule   Oral   Take 300 mg by mouth 2 (two) times daily.         Marland Kitchen PRESCRIPTION MEDICATION   Oral   Take 1 tablet by mouth 2 (two) times daily.         . promethazine (PHENERGAN) 12.5 MG tablet   Oral   Take 1 tablet (12.5 mg total) by mouth every 6 (six) hours as needed for nausea.   30 tablet   0    BP 112/72  Pulse 94  Temp(Src) 99 F (37.2 C) (Oral)  Resp 16  SpO2 98% Physical Exam  Nursing note and vitals reviewed. Constitutional: He is oriented to person, place, and time. He appears well-developed and well-nourished.  HENT:  Head: Normocephalic and atraumatic.  Eyes: Conjunctivae are normal. Right eye exhibits no discharge. Left eye exhibits no discharge.  Neck: Normal range of motion. Neck supple. No tracheal deviation present.  Cardiovascular: Normal rate and regular rhythm.   Pulmonary/Chest: Effort normal and breath sounds normal.  Abdominal: Soft. He exhibits no distension. There is no tenderness. There is no guarding.  Musculoskeletal: He exhibits edema. He exhibits no tenderness.  Mild edema right UE diffuse, soft compartment, full rom, no warmth or erythema, nv intact distal  Neurological: He is alert and oriented to person, place, and time.  Skin: Skin is warm. No rash noted.  Psychiatric: He has a normal mood and affect.    ED Course  Procedures (including critical care time) Labs  Review Labs Reviewed - No data to display Imaging Review No results found.  MDM  No diagnosis found. Xray to look for pathologic fx. [place] to look for dvt..  Dg Shoulder Right  07/29/2013   CLINICAL DATA:  Right shoulder pain, numbness/tingling, swelling  EXAM: RIGHT SHOULDER - 2+ VIEW  COMPARISON:  None.  FINDINGS: No fracture or dislocation is seen.  Mild spurring along the inferior glenoid rim, likely degenerative.  Visualized right lung is clear.  Right chest power port.  IMPRESSION: No acute osseous abnormality is seen.   Electronically Signed   By: Charline Bills M.D.   On: 07/29/2013 13:45    [place] tech confirmed DVT RUE.  Normal neuro exam, no signs of compartment syndrome, No signs of PE.   Discussed case with pharmacy and Dr Rosie Fate hematology, no indication to remove line port immediately, with pork allergy Rivaroxaban and close fup with his oncologist outpt.  Pt comfortable with outpt fup, daughter comfortable translating. Details reviewed at length and reasons to return.   Recent normal Creatinine.  Xarelto dose given in ED.  Bleeding risks discussed.  Paged patient's oncologist to ensure close fup outpt. Final dispo pending return call.  Rechecked, pt comfortable.   Spoke with [facility], okay with plan, will see outpt.  Enid Skeens, MD 07/29/13 1635  Enid Skeens, MD 07/29/13 [phone]

## 2013-08-05 ENCOUNTER — Encounter (HOSPITAL_COMMUNITY): Payer: Self-pay | Admitting: Emergency Medicine

## 2013-08-05 ENCOUNTER — Emergency Department (HOSPITAL_COMMUNITY): Payer: Medicaid Other

## 2013-08-05 ENCOUNTER — Inpatient Hospital Stay (HOSPITAL_COMMUNITY)
Admission: EM | Admit: 2013-08-05 | Discharge: 2013-08-09 | DRG: 872 | Disposition: A | Discharge status: Home / Self Care | Payer: Medicaid Other | Attending: Internal Medicine | Admitting: Internal Medicine

## 2013-08-05 DIAGNOSIS — J189 Pneumonia, unspecified organism: Secondary | ICD-10-CM

## 2013-08-05 DIAGNOSIS — I959 Hypotension, unspecified: Secondary | ICD-10-CM | POA: Diagnosis present

## 2013-08-05 DIAGNOSIS — K7689 Other specified diseases of liver: Secondary | ICD-10-CM | POA: Diagnosis present

## 2013-08-05 DIAGNOSIS — I82409 Acute embolism and thrombosis of unspecified deep veins of unspecified lower extremity: Secondary | ICD-10-CM | POA: Diagnosis present

## 2013-08-05 DIAGNOSIS — Z833 Family history of diabetes mellitus: Secondary | ICD-10-CM

## 2013-08-05 DIAGNOSIS — Z9221 Personal history of antineoplastic chemotherapy: Secondary | ICD-10-CM

## 2013-08-05 DIAGNOSIS — R5081 Fever presenting with conditions classified elsewhere: Secondary | ICD-10-CM | POA: Diagnosis present

## 2013-08-05 DIAGNOSIS — R531 Weakness: Secondary | ICD-10-CM

## 2013-08-05 DIAGNOSIS — D61818 Other pancytopenia: Secondary | ICD-10-CM | POA: Diagnosis present

## 2013-08-05 DIAGNOSIS — J069 Acute upper respiratory infection, unspecified: Secondary | ICD-10-CM | POA: Diagnosis present

## 2013-08-05 DIAGNOSIS — C499 Malignant neoplasm of connective and soft tissue, unspecified: Secondary | ICD-10-CM | POA: Diagnosis present

## 2013-08-05 DIAGNOSIS — I82401 Acute embolism and thrombosis of unspecified deep veins of right lower extremity: Secondary | ICD-10-CM

## 2013-08-05 DIAGNOSIS — I10 Essential (primary) hypertension: Secondary | ICD-10-CM | POA: Diagnosis present

## 2013-08-05 DIAGNOSIS — A419 Sepsis, unspecified organism: Principal | ICD-10-CM | POA: Diagnosis present

## 2013-08-05 DIAGNOSIS — C4920 Malignant neoplasm of connective and soft tissue of unspecified lower limb, including hip: Secondary | ICD-10-CM | POA: Diagnosis present

## 2013-08-05 DIAGNOSIS — R918 Other nonspecific abnormal finding of lung field: Secondary | ICD-10-CM | POA: Diagnosis present

## 2013-08-05 DIAGNOSIS — D899 Disorder involving the immune mechanism, unspecified: Secondary | ICD-10-CM | POA: Diagnosis present

## 2013-08-05 DIAGNOSIS — I82C19 Acute embolism and thrombosis of unspecified internal jugular vein: Secondary | ICD-10-CM | POA: Diagnosis present

## 2013-08-05 DIAGNOSIS — R05 Cough: Secondary | ICD-10-CM

## 2013-08-05 DIAGNOSIS — K8020 Calculus of gallbladder without cholecystitis without obstruction: Secondary | ICD-10-CM | POA: Diagnosis present

## 2013-08-05 DIAGNOSIS — C7989 Secondary malignant neoplasm of other specified sites: Secondary | ICD-10-CM | POA: Diagnosis present

## 2013-08-05 DIAGNOSIS — T451X5A Adverse effect of antineoplastic and immunosuppressive drugs, initial encounter: Secondary | ICD-10-CM | POA: Diagnosis present

## 2013-08-05 DIAGNOSIS — R059 Cough, unspecified: Secondary | ICD-10-CM | POA: Diagnosis present

## 2013-08-05 DIAGNOSIS — E860 Dehydration: Secondary | ICD-10-CM | POA: Diagnosis present

## 2013-08-05 DIAGNOSIS — I82B19 Acute embolism and thrombosis of unspecified subclavian vein: Secondary | ICD-10-CM | POA: Diagnosis present

## 2013-08-05 DIAGNOSIS — Z87442 Personal history of urinary calculi: Secondary | ICD-10-CM

## 2013-08-05 DIAGNOSIS — R42 Dizziness and giddiness: Secondary | ICD-10-CM | POA: Diagnosis present

## 2013-08-05 DIAGNOSIS — D703 Neutropenia due to infection: Secondary | ICD-10-CM | POA: Diagnosis present

## 2013-08-05 DIAGNOSIS — I2510 Atherosclerotic heart disease of native coronary artery without angina pectoris: Secondary | ICD-10-CM | POA: Diagnosis present

## 2013-08-05 DIAGNOSIS — R5383 Other fatigue: Secondary | ICD-10-CM

## 2013-08-05 HISTORY — DX: Other nonspecific abnormal finding of lung field: R91.8

## 2013-08-05 HISTORY — DX: Fatty (change of) liver, not elsewhere classified: K76.0

## 2013-08-05 HISTORY — DX: Acute embolism and thrombosis of unspecified deep veins of unspecified lower extremity: I82.409

## 2013-08-05 HISTORY — DX: Atherosclerotic heart disease of native coronary artery without angina pectoris: I25.10

## 2013-08-05 HISTORY — DX: Calculus of gallbladder without cholecystitis without obstruction: K80.20

## 2013-08-05 LAB — COMPREHENSIVE METABOLIC PANEL
ALT: 8 U/L (ref 0–53)
Alkaline Phosphatase: 82 U/L (ref 39–117)
BUN: 11 mg/dL (ref 6–23)
CO2: 24 mEq/L (ref 19–32)
Calcium: 8.3 mg/dL — ABNORMAL LOW (ref 8.4–10.5)
GFR calc Af Amer: >90 mL/min (ref >90)
GFR calc non Af Amer: >90 mL/min (ref >90)
Glucose, Bld: 113 mg/dL — ABNORMAL HIGH (ref 70–99)
Potassium: 3.6 mEq/L (ref 3.5–5.1)
Sodium: 132 mEq/L — ABNORMAL LOW (ref 135–145)
Total Bilirubin: 0.5 mg/dL (ref 0.3–1.2)

## 2013-08-05 LAB — LACTIC ACID, PLASMA: Lactic Acid, Venous: 1.4 mmol/L (ref 0.5–2.2)

## 2013-08-05 LAB — PROTIME-INR
INR: 1.24 (ref 0.00–1.49)
Prothrombin Time: 15.3 seconds — ABNORMAL HIGH (ref 11.6–15.2)

## 2013-08-05 LAB — CBC
HCT: 22 % — ABNORMAL LOW (ref 39.0–52.0)
Hemoglobin: 7.8 g/dL — ABNORMAL LOW (ref 13.0–17.0)
MCH: 30.1 pg (ref 26.0–34.0)
Platelets: 96 10*3/uL — ABNORMAL LOW (ref 150–400)
RBC: 2.59 MIL/uL — ABNORMAL LOW (ref 4.22–5.81)
WBC: 2 10*3/uL — ABNORMAL LOW (ref 4.0–10.5)

## 2013-08-05 LAB — TYPE AND SCREEN: Antibody Screen: NEGATIVE

## 2013-08-05 LAB — FIBRINOGEN: Fibrinogen: 738 mg/dL — ABNORMAL HIGH (ref 204–475)

## 2013-08-05 LAB — TROPONIN I: Troponin I: <0.3 ng/mL (ref <0.30)

## 2013-08-05 MED ORDER — VANCOMYCIN HCL IN DEXTROSE 1-5 GM/200ML-% IV SOLN
1000.0000 mg | Freq: Three times a day (TID) | INTRAVENOUS | Status: DC
  Administered 2013-08-05: 1000 mg via INTRAVENOUS
  Filled 2013-08-05 (×2): qty 200

## 2013-08-05 MED ORDER — PIPERACILLIN-TAZOBACTAM 3.375 G IVPB 30 MIN
3.3750 g | Freq: Once | INTRAVENOUS | Status: AC
  Administered 2013-08-05: 3.375 g via INTRAVENOUS
  Filled 2013-08-05: qty 50

## 2013-08-05 MED ORDER — DEXTROSE 5 % IV SOLN
1.0000 g | Freq: Once | INTRAVENOUS | Status: DC
  Filled 2013-08-05: qty 10

## 2013-08-05 MED ORDER — DEXTROSE 5 % IV SOLN: 500.0000 mg | Freq: Once | INTRAVENOUS | Status: DC

## 2013-08-05 MED ORDER — PIPERACILLIN-TAZOBACTAM 3.375 G IVPB: 3.3750 g | Freq: Three times a day (TID) | INTRAVENOUS | Status: DC

## 2013-08-05 MED ORDER — SODIUM CHLORIDE 0.9 % IV BOLUS (SEPSIS)
2000.0000 mL | Freq: Once | INTRAVENOUS | Status: AC
  Administered 2013-08-05: 2000 mL via INTRAVENOUS

## 2013-08-05 MED ORDER — SODIUM CHLORIDE 0.9 % IV BOLUS (SEPSIS)
1000.0000 mL | INTRAVENOUS | Status: DC | PRN
  Administered 2013-08-05: 1000 mL via INTRAVENOUS

## 2013-08-05 NOTE — ED Notes (Signed)
Pt. reports productive cough for 3 weeks with generalized weakness /dizziness and lightheaded . Daughter translating ( spanish ) for pt. Pt. Is scheduled for left leg surgery in 2 weeks - history of sarcoma had received chemotherapy.

## 2013-08-05 NOTE — Progress Notes (Signed)
ANTIBIOTIC CONSULT NOTE - INITIAL  Pharmacy Consult for zosyn and vancomycin Indication: suspected PNA/sepsis  Allergies  Allergen Reactions  . Pork-Derived Products     Not an allergy - religious belief to not consume anything pork-derived **Patient and family state it is okay to have medications that alert [place] with the "pork-derived" warning**     Patient Measurements: weight 61 kg, height 63 inches    Vital Signs: Temp: 98.8 F (37.1 C) (09/25 2140) Temp src: Oral (09/25 2140) BP: 96/59 mmHg (09/25 2140) Pulse Rate: 114 (09/25 2140) Intake/Output from previous day:   Intake/Output from this shift:    Labs: No results found for this basename: WBC, HGB, PLT, LABCREA, CREATININE,  in the last 72 hours The CrCl is unknown because both a height and weight (above a minimum accepted value) are required for this calculation. No results found for this basename: VANCOTROUGH, VANCOPEAK, VANCORANDOM, GENTTROUGH, GENTPEAK, GENTRANDOM, TOBRATROUGH, TOBRAPEAK, TOBRARND, AMIKACINPEAK, AMIKACINTROU, AMIKACIN,  in the last 72 hours   Microbiology: No results found for this or any previous visit (from the past 720 hour(s)).  Medical History: Past Medical History  Diagnosis Date  . Hypertension   . Cancer     BONE CA-- CHEMO    Medications:  See med rec  Assessment: Patient is a 57 y.o M with sarcoma on chemotherapy.  Per granddaughter, he is scheduled to have his Left leg amputated on October 6th to remove the sarcoma.  He presented to the ED with c/o generalized weakness.  To start broad abx for suspected PNA and sepsis.  He is also on Xarelto for DVT in  right right internal jugular vein and right subclavian vein (9/18). Patient said he already took both of his xarelto doses for today 9/25.  Goal of Therapy:  Vancomycin trough level 15-20 mcg/ml  Plan:  1) vancomycin 1gm IV q8h 2) zosyn 3.375gm IV x1 over 30 minutes, then zosyn 3.375gm IV q8h (over 4 hours)  Kenroy Timberman  P 08/05/2013,10:27 PM

## 2013-08-05 NOTE — ED Notes (Signed)
XRAY called to inform we need a chest portable xray, en route

## 2013-08-05 NOTE — ED Provider Notes (Signed)
CSN: [id number]     Arrival date & time 08/05/13  2135 History   First MD Initiated Contact with Patient 08/05/13 2201     Chief Complaint  Patient presents with  . Cough  . Dizziness   (Consider location/radiation/quality/duration/timing/severity/associated sxs/prior Treatment) HPI Comments: 57 year old hispanic male with a past medical history of hypertension and leg bone sarcoma being treated at [facility] since the emergency department with his granddaughter who is translating for him complaining of worsening productive cough with yellow mucus for the past 3 weeks. Over the past few days he has begun to feel weak, dizzy, lightheaded and fatigued. He has had chills on and off. No known fever admits to associated nausea and vomiting yesterday. Denies any urinary or bowel changes. Last chemotherapy treatment was 2 weeks ago. He is also being treated with Xarelto for an upper extremity DVT diagnosed on September 18. Denies any sick contacts. Had tylenol 2 days ago.  Patient is a 57 y.o. male presenting with cough. The history is provided by the patient and a relative. The history is limited by a language barrier. A language interpreter was used.  Cough Associated symptoms: chills and shortness of breath   Associated symptoms: no chest pain and no fever     Past Medical History  Diagnosis Date  . Hypertension   . Cancer     BONE CA-- CHEMO   History reviewed. No pertinent past surgical history. No family history on file. History  Substance Use Topics  . Smoking status: Never Smoker   . Smokeless tobacco: Never Used  . Alcohol Use: No    Review of Systems  Constitutional: Positive for chills, activity change and fatigue. Negative for fever.  Respiratory: Positive for cough and shortness of breath.   Cardiovascular: Negative for chest pain.  Gastrointestinal: Positive for nausea and vomiting. Negative for abdominal pain.  Genitourinary: Negative.   Musculoskeletal: Negative  for back pain.  Neurological: Positive for weakness.  Psychiatric/Behavioral: Negative for confusion.  All other systems reviewed and are negative.    Allergies  Pork-derived products  Home Medications   Current Outpatient Rx  Name  Route  Sig  Dispense  Refill  . acetaminophen (TYLENOL) 325 MG tablet   Oral   Take 650 mg by mouth once.         Marland Kitchen LORazepam (ATIVAN) 1 MG tablet   Oral   Take 1 mg by mouth every 6 (six) hours as needed (nausea).          . prochlorperazine (COMPAZINE) 10 MG tablet   Oral   Take 10 mg by mouth every 6 (six) hours as needed (nausea).         . Rivaroxaban (XARELTO) 15 MG TABS tablet   Oral   Take 1 tablet (15 mg total) by mouth 2 (two) times daily with a meal.   42 tablet   0    BP 103/69  Pulse 103  Temp(Src) 100 F (37.8 C) (Oral)  Resp 22  SpO2 97% Physical Exam  Nursing note and vitals reviewed. Constitutional: He is oriented to person, place, and time. He appears well-developed and well-nourished. He appears ill. No distress. Face mask in place.  HENT:  Head: Normocephalic and atraumatic.  Mouth/Throat: Uvula is midline, oropharynx is clear and moist and mucous membranes are normal.  Eyes: Conjunctivae are normal. No scleral icterus.  Neck: Normal range of motion. Neck supple.  Cardiovascular: Regular rhythm, intact distal pulses and normal pulses.  Tachycardia present.  Pulmonary/Chest: Tachypnea noted. No respiratory distress. He has rhonchi (scattered expiratory, L>R).  Abdominal: Soft. Bowel sounds are normal. He exhibits no distension. There is no tenderness.  Musculoskeletal: Normal range of motion. He exhibits no edema.  Neurological: He is alert and oriented to person, place, and time.  Skin: Skin is warm and dry.  Psychiatric: He has a normal mood and affect. His behavior is normal.    ED Course  Procedures (including critical care time) Labs Review Labs Reviewed  CBC - Abnormal; Notable for the following:     WBC 2.0 (*)    RBC 2.59 (*)    Hemoglobin 7.8 (*)    HCT 22.0 (*)    Platelets 96 (*)    All other components within normal limits  COMPREHENSIVE METABOLIC PANEL - Abnormal; Notable for the following:    Sodium 132 (*)    Glucose, Bld 113 (*)    Calcium 8.3 (*)    Albumin 2.9 (*)    All other components within normal limits  URINALYSIS, ROUTINE W REFLEX MICROSCOPIC - Abnormal; Notable for the following:    APPearance CLOUDY (*)    Urobilinogen, UA 2.0 (*)    All other components within normal limits  PROTIME-INR - Abnormal; Notable for the following:    Prothrombin Time 15.3 (*)    All other components within normal limits  APTT - Abnormal; Notable for the following:    aPTT 47 (*)    All other components within normal limits  FIBRINOGEN - Abnormal; Notable for the following:    Fibrinogen 738 (*)    All other components within normal limits  CULTURE, BLOOD (ROUTINE X 2)  CULTURE, BLOOD (ROUTINE X 2)  URINE CULTURE  CULTURE, EXPECTORATED SPUTUM-ASSESSMENT  LACTIC ACID, PLASMA  TROPONIN I  CORTISOL  CG4 I-STAT (LACTIC ACID)  TYPE AND SCREEN  ABO/RH   Imaging Review Dg Chest Portable 1 View  08/06/2013   CLINICAL DATA:  Sepsis. History of hypertension and cancer.  EXAM: PORTABLE CHEST - 1 VIEW  COMPARISON:  Abdominal pelvic CT 02/19/2011. No prior chest radiographs available.  FINDINGS: 2346 hr. Right IJ Port-A-Cath tip is at the level of the superior right atrium. There are low lung volumes with mild patient rotation to the left. There is some perihilar fullness on the left which may be related to the rotation. Mild central airway thickening is present bilaterally without confluent airspace opacity or pleural effusion. No osseous abnormalities are seen.  IMPRESSION: Low lung volumes with perihilar opacities bilaterally and left hilar fullness. The latter may be secondary to patient rotation; a left hilar mass cannot be excluded based on this examination. Follow up PA and  lateral views recommended.   Electronically Signed   By: Roxy Horseman   On: 08/06/2013 00:03    MDM   1. Community acquired pneumonia   2. Cough   3. Fatigue   4. Weakness     Patient with cough, generalized weakness and fatigue. He is immunocompromised as he is undergoing chemotherapy for sarcoma. He appears ill. He is hypotensive, tachycardic and tachypneic. Phase I sepsis protocol initiated. Possible sepsis from pneumonia. Chest x-ray still pending. Scattered rhonchi, left worse than right heard on exam. Per pharmacy, and Zosyn and vancomycin will be started for broad-spectrum antibiotics given he is immunocompromised. 12:20 AM Chest x-ray with findings as shown above. Blood pressure increased to 103/61 after 2 L of fluid. He is still ill appearing. He has a white blood cell count of  2.0. He will be admitted to the hospital for pneumonia. It has been over 90 days since he was admitted previously. Admission accepted by internal medicine teaching service. Case discussed with my attending Dr. Romeo Apple who agrees with plan of care.   Trevor Mace, PA-C 08/06/13 0023

## 2013-08-06 ENCOUNTER — Encounter (HOSPITAL_COMMUNITY): Payer: Self-pay | Admitting: Internal Medicine

## 2013-08-06 ENCOUNTER — Inpatient Hospital Stay (HOSPITAL_COMMUNITY): Payer: Medicaid Other

## 2013-08-06 ENCOUNTER — Emergency Department (HOSPITAL_COMMUNITY): Payer: Medicaid Other

## 2013-08-06 DIAGNOSIS — A419 Sepsis, unspecified organism: Secondary | ICD-10-CM

## 2013-08-06 DIAGNOSIS — C4920 Malignant neoplasm of connective and soft tissue of unspecified lower limb, including hip: Secondary | ICD-10-CM

## 2013-08-06 DIAGNOSIS — R05 Cough: Secondary | ICD-10-CM | POA: Diagnosis present

## 2013-08-06 DIAGNOSIS — D61818 Other pancytopenia: Secondary | ICD-10-CM

## 2013-08-06 DIAGNOSIS — I959 Hypotension, unspecified: Secondary | ICD-10-CM

## 2013-08-06 LAB — DIFFERENTIAL
Band Neutrophils: 0 % (ref 0–10)
Basophils Relative: 0 % (ref 0–1)
Blasts: 0 %
Eosinophils Absolute: 0.1 10*3/uL (ref 0.0–0.7)
Lymphocytes Relative: 43 % (ref 12–46)
Monocytes Relative: 23 % — ABNORMAL HIGH (ref 3–12)
Neutro Abs: 0.5 10*3/uL — ABNORMAL LOW (ref 1.7–7.7)
nRBC: 0 /100 WBC

## 2013-08-06 LAB — CBC WITH DIFFERENTIAL/PLATELET
Basophils Absolute: 0 10*3/uL (ref 0.0–0.1)
Basophils Relative: 1 % (ref 0–1)
Eosinophils Absolute: 0.1 10*3/uL (ref 0.0–0.7)
Hemoglobin: 6.7 g/dL — CL (ref 13.0–17.0)
MCH: 30 pg (ref 26.0–34.0)
MCHC: 35.1 g/dL (ref 30.0–36.0)
Monocytes Absolute: 0.4 10*3/uL (ref 0.1–1.0)
Monocytes Relative: 32 % — ABNORMAL HIGH (ref 3–12)
Neutro Abs: 0.5 10*3/uL — ABNORMAL LOW (ref 1.7–7.7)
Neutrophils Relative %: 33 % — ABNORMAL LOW (ref 43–77)
Platelets: 91 10*3/uL — ABNORMAL LOW (ref 150–400)
RDW: 15.6 % — ABNORMAL HIGH (ref 11.5–15.5)

## 2013-08-06 LAB — BASIC METABOLIC PANEL
BUN: 8 mg/dL (ref 6–23)
Calcium: 7.3 mg/dL — ABNORMAL LOW (ref 8.4–10.5)
Creatinine, Ser: 0.71 mg/dL (ref 0.50–1.35)
GFR calc non Af Amer: >90 mL/min (ref >90)
Glucose, Bld: 105 mg/dL — ABNORMAL HIGH (ref 70–99)
Potassium: 3.3 mEq/L — ABNORMAL LOW (ref 3.5–5.1)

## 2013-08-06 LAB — CBC
Hemoglobin: 7.8 g/dL — ABNORMAL LOW (ref 13.0–17.0)
MCH: 30.4 pg (ref 26.0–34.0)
MCV: 86 fL (ref 78.0–100.0)
RBC: 2.57 MIL/uL — ABNORMAL LOW (ref 4.22–5.81)
RDW: 15.6 % — ABNORMAL HIGH (ref 11.5–15.5)

## 2013-08-06 LAB — URINALYSIS, ROUTINE W REFLEX MICROSCOPIC
Bilirubin Urine: NEGATIVE
Hgb urine dipstick: NEGATIVE
Ketones, ur: NEGATIVE mg/dL
Protein, ur: NEGATIVE mg/dL
Urobilinogen, UA: 2 mg/dL — ABNORMAL HIGH (ref 0.0–1.0)
pH: 6 (ref 5.0–8.0)

## 2013-08-06 LAB — CORTISOL: Cortisol, Plasma: 16.7 ug/dL

## 2013-08-06 LAB — PATHOLOGIST SMEAR REVIEW

## 2013-08-06 LAB — TECHNOLOGIST SMEAR REVIEW

## 2013-08-06 LAB — MAGNESIUM: Magnesium: 1.8 mg/dL (ref 1.5–2.5)

## 2013-08-06 LAB — ABO/RH: ABO/RH(D): O POS

## 2013-08-06 MED ORDER — ENSURE COMPLETE PO LIQD
237.0000 mL | Freq: Two times a day (BID) | ORAL | Status: DC
  Administered 2013-08-06 – 2013-08-09 (×6): 237 mL via ORAL
  Filled 2013-08-06: qty 237

## 2013-08-06 MED ORDER — HYDROCOD POLST-CHLORPHEN POLST 10-8 MG/5ML PO LQCR
5.0000 mL | Freq: Two times a day (BID) | ORAL | Status: DC
  Administered 2013-08-06 – 2013-08-09 (×7): 5 mL via ORAL
  Filled 2013-08-06 (×7): qty 5

## 2013-08-06 MED ORDER — SODIUM CHLORIDE 0.9 % IV SOLN
1000.0000 mL | Freq: Once | INTRAVENOUS | Status: AC
  Administered 2013-08-06: 1000 mL via INTRAVENOUS

## 2013-08-06 MED ORDER — SODIUM CHLORIDE 0.9 % IV SOLN: 1000.0000 mL | Freq: Once | INTRAVENOUS | Status: DC

## 2013-08-06 MED ORDER — POTASSIUM CHLORIDE 10 MEQ/100ML IV SOLN
10.0000 meq | INTRAVENOUS | Status: AC
  Administered 2013-08-06 (×4): 10 meq via INTRAVENOUS
  Filled 2013-08-06 (×3): qty 100

## 2013-08-06 MED ORDER — ENOXAPARIN SODIUM 80 MG/0.8ML ~~LOC~~ SOLN
65.0000 mg | Freq: Two times a day (BID) | SUBCUTANEOUS | Status: DC
  Administered 2013-08-07 – 2013-08-09 (×5): 65 mg via SUBCUTANEOUS
  Filled 2013-08-06 (×10): qty 0.8

## 2013-08-06 MED ORDER — SODIUM CHLORIDE 0.9 % IV SOLN: 1000.0000 mL | INTRAVENOUS | Status: DC

## 2013-08-06 MED ORDER — ALBUTEROL SULFATE (5 MG/ML) 0.5% IN NEBU
2.5000 mg | INHALATION_SOLUTION | Freq: Once | RESPIRATORY_TRACT | Status: AC
  Administered 2013-08-06: 2.5 mg via RESPIRATORY_TRACT
  Filled 2013-08-06: qty 0.5

## 2013-08-06 MED ORDER — SODIUM CHLORIDE 0.9 % IJ SOLN
3.0000 mL | Freq: Two times a day (BID) | INTRAMUSCULAR | Status: DC
  Administered 2013-08-06 – 2013-08-09 (×5): 3 mL via INTRAVENOUS

## 2013-08-06 MED ORDER — ONDANSETRON HCL 4 MG/2ML IJ SOLN
4.0000 mg | Freq: Four times a day (QID) | INTRAMUSCULAR | Status: DC | PRN
  Administered 2013-08-06: 4 mg via INTRAVENOUS
  Filled 2013-08-06: qty 2

## 2013-08-06 MED ORDER — DEXTROSE 5 % IV SOLN
1.0000 g | Freq: Three times a day (TID) | INTRAVENOUS | Status: DC
  Administered 2013-08-06 – 2013-08-08 (×8): 1 g via INTRAVENOUS
  Filled 2013-08-06 (×11): qty 1

## 2013-08-06 MED ORDER — RIVAROXABAN 15 MG PO TABS
15.0000 mg | ORAL_TABLET | Freq: Two times a day (BID) | ORAL | Status: DC
  Administered 2013-08-06: 15 mg via ORAL
  Filled 2013-08-06 (×3): qty 1

## 2013-08-06 MED ORDER — SODIUM CHLORIDE 0.9 % IV SOLN
INTRAVENOUS | Status: AC
  Administered 2013-08-06 (×2): via INTRAVENOUS

## 2013-08-06 MED ORDER — ONDANSETRON HCL 4 MG PO TABS: 4.0000 mg | ORAL_TABLET | Freq: Four times a day (QID) | ORAL | Status: DC | PRN

## 2013-08-06 NOTE — Progress Notes (Signed)
ANTICOAGULATION CONSULT NOTE - Initial Consult  Pharmacy Consult for Lovenox Indication: DVT  Allergies  Allergen Reactions  . Pork-Derived Products     Not an allergy - religious belief to not consume anything pork-derived **Patient and family state it is okay to have medications that alert [place] with the "pork-derived" warning**     Patient Measurements: Height: 5\' 4"  (162.6 cm) Weight: 141 lb 5 oz (64.1 kg) IBW/kg (Calculated) : 59.2 Heparin Dosing Weight:   Vital Signs: Temp: 98 F (36.7 C) (09/26 0700) Temp src: Oral (09/26 0700) BP: 103/62 mmHg (09/26 0800) Pulse Rate: 94 (09/26 0800)  Labs:  Recent Labs  08/05/13 2220 08/06/13 0408 08/06/13 0918  HGB 7.8* 6.7* 7.8*  HCT 22.0* 19.1* 22.1*  PLT 96* 91* 104*  APTT 47*  --   --   LABPROT 15.3*  --   --   INR 1.24  --   --   CREATININE 0.88 0.71  --   TROPONINI <0.30  --   --     Estimated Creatinine Clearance: 84.3 ml/min (by C-G formula based on Cr of 0.71).   Medical History: Past Medical History  Diagnosis Date  . Hypertension   . Cancer     Dr. Lin Givens at [facility] is H/O; BONE CA-- CHEMO; pleomorphic sarcoma left leg/hip  . Coronary atherosclerosis     per CT 07/13/13 at [facility]  . Cholelithiases     per CT 07/13/13  . Fatty liver     per CT 07/13/13 at [facility]  . Pulmonary nodules     per CT 07/13/13 at [facility]; multiple b/l   . DVT (deep venous thrombosis)     dx 9/18 right IJ and R SCV on Xarelto    Medications:  Scheduled:  . ceFEPime (MAXIPIME) IV  1 g Intravenous Q8H  . chlorpheniramine-HYDROcodone  5 mL Oral Q12H  . feeding supplement  237 mL Oral BID BM  . sodium chloride  3 mL Intravenous Q12H    Assessment: 57yo male with pleomorphic sarcoma and RUE DVT & currently on Xarelto, to change to Lovenox with plans to continue home Lovenox.  No renal adjustment is necessary.  His last Xarelto dose was at 9AM this morning.   Goal of Therapy:  Anti-Xa level 0.6-1.2 units/ml 4hrs after LMWH dose given Monitor  platelets by anticoagulation protocol: Yes   Plan:  1.  Lovenox 65mg  SQ q12, first dose at 9AM on 9/27 2.  CBC q72, to start 9/28   Marisue Humble, PharmD Clinical Pharmacist [facility]

## 2013-08-06 NOTE — ED Provider Notes (Signed)
Medical screening examination/treatment/procedure(s) were conducted as a shared visit with non-physician practitioner(s) and myself.  I personally evaluated the patient during the encounter  I interviewed and examined the patient. Lungs are CTAB, although poor insp effort noted. Cardiac exam wnl. Abdomen soft. Possible pna. Broad spectrum abx initiated d/t likely immunocompromise w/ chemo. Will admit.   Junius Argyle, MD 08/06/13 1430

## 2013-08-06 NOTE — H&P (Signed)
  Date: 08/06/2013  Patient name: Gregory Frazier  Medical record number: [id number]  Date of birth: 1956/01/28   I have seen and evaluated Gregory Frazier and discussed their care with the Residency Team. Gregory Frazier was admitted with a neutropenic fever. He is / was on chemo for a sarcoma and has a planned hemipelvectomy in October at [facility]. He was also dx with a RUE DVT on 9/18 and was started on Xarelto.  Assessment and Plan: I have seen and evaluated the patient as outlined above. I agree with the formulated Assessment and Plan as detailed in the residents' admission note, with the following changes:   1. Sepsis & Febrile neutropenia - His source is the lower resp tract. He is being covered with Cefepime. Cultures are pending. He is on neutropenic precautions. He has been hydrating and IVF are continuing. He is perfusing all organs and mentating well.   2. Acute RU extremity DVT - Since guidelines rec Lovenox in pts with malignancy, and the pt is agreeable to using Lovenox, we have changed from Xarelto to Lovenox. His catheter is in the RU chest but is functioning and does not need removed.   3. Sarcoma - per the sarcoma team at [facility].  Burns Spain, MD 9/26/201412:28 PM

## 2013-08-06 NOTE — H&P (Signed)
Date: 08/06/2013               Patient Name:  Gregory Frazier MRN: [id number]  DOB: 01-03-1956 Age / Sex: 57 y.o., male   PCP: No Pcp Per Patient         Medical Service: Internal Medicine Teaching Service         Attending Physician: Dr. Rogelia Boga    First Contact: Dr. Darci Needle Pager: [phone]  Second Contact: Dr. Sherrine Maples Pager: [phone]       After Hours (After 5p/  First Contact Pager: [phone]  weekends / holidays): Second Contact Pager: [phone]   Chief Complaint: productive cough x 3 weeks, dizziness  History of Present Illness:  Mr. 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  Patient is only [place] speaking, granddaughter Gregory Frazier, 47 years old) was present and translating.    Patient complains of worsening cough productive of white to green sputum x 3 weeks, worst at night; denies hemoptysis.  Endorses chills (but no fever), congestion, runny nose, sore throat without dysphagia.  Denies associated shortness of breath, chest pain, palpitations.  Of note, patient lives with his son's family and his son has been sick with a cold, two grandsons have both been coughing.    Over the past few days, patient has begun to feel weak and lightheaded.  Of note, patient has had greatly decreased PO intake in the last week or so, he has not been eating much at all and has only been drinking water with medicines.  He has become so weak that he can no longer stand.  Patient has also been feeling nauseated since June when chemotherapy was initiated.  He had one episode of emesis yesterday, unsure of appearance, but no blood; still feels nauseated.   In terms of his recently diagnosed DVT, patient came into ED last week with purple  swollen right arm.  He had RUE doppler at that time which showed DVT involving the right internal jugular vein and right subclavian vein.  He was prescribed Xarelto and sent home with PCP follow-up.  At follow-up visit (unsure of physician's name), swelling had resolved and patient was instructed to continue taking Xarelto.    Patient reportedly had no medical problems until he was diagnosed with pleomorphic sarcoma in 04/2013.  His leg pain has greatly decreased since initiation of chemotherapy.  He does not take any medications at home other that some pain medicines that are sent from [place].  He is not currently in any pain.   Denies abdominal pain, change in bowel or bladder habits. Last BM was Wednesday (9/24)- normal brown stool, no blood; denies dysuria.    Meds: Current Facility-Administered Medications  Medication Dose Route Frequency Provider Last Rate Last Dose  . ceFEPIme (MAXIPIME) 1 g in dextrose 5 % 50 mL IVPB  1 g Intravenous Q8H Junius Argyle, MD      . feeding supplement (ENSURE COMPLETE) liquid 237 mL  237 mL Oral BID BM Annett Gula, MD      . vancomycin (VANCOCIN) IVPB 1000 mg/200 mL premix  1,000 mg Intravenous Q8H Junius Argyle, MD   1,000 mg at 08/05/13 2329    Allergies: Allergies as of 08/05/2013 - Review Complete  08/05/2013  Allergen Reaction Noted  . Pork-derived products  07/29/2013   Past Medical History  Diagnosis Date  . Hypertension   . Cancer     Dr. Lin Givens at [facility] is H/O; BONE CA-- CHEMO; pleomorphic sarcoma left leg/hip  . Coronary atherosclerosis     per CT 07/13/13 at [facility]  . Cholelithiases     per CT 07/13/13  . Fatty liver     per CT 07/13/13 at [facility]  . Pulmonary nodules     per CT 07/13/13 at [facility] multiple b/l   . DVT (deep venous thrombosis)     dx 9/18 right IJ and R SCV on Xarelto   Past Surgical History  Procedure Laterality Date  . Other surgical history      renal stone  . Other surgical history      FNA  . Other surgical  history      Bx of left pelvic mass iliac bone    Family History  Problem Relation Age of Onset  . Cancer Mother   . Diabetes Sister   . Other      grandaughter with immature teratoma   History   Social History  . Marital Status: Married    Spouse Name: N/A    Number of Children: N/A  . Years of Education: N/A   Occupational History  . Not on file.   Social History Main Topics  . Smoking status: Never Smoker   . Smokeless tobacco: Never Used  . Alcohol Use: No  . Drug Use: No  . Sexual Activity: Yes   Other Topics Concern  . Not on file   Social History Narrative  . No narrative on file    Review of Systems: Review of Systems  Constitutional: Positive for chills and malaise/fatigue. Negative for fever, weight loss and diaphoresis.  HENT: Positive for congestion and sore throat. Negative for ear pain and nosebleeds.   Eyes: Negative for blurred vision.  Respiratory: Positive for cough, sputum production and wheezing. Negative for hemoptysis.   Cardiovascular: Negative for chest pain, palpitations and leg swelling.  Gastrointestinal: Positive for nausea and vomiting. Negative for abdominal pain, diarrhea, constipation and blood in stool.  Genitourinary: Negative for dysuria and hematuria.  Musculoskeletal: Negative for joint pain and falls.  Skin: Negative for rash.  Neurological: Positive for dizziness, weakness and headaches. Negative for tingling, focal weakness, seizures and loss of consciousness.    Physical Exam: Blood pressure 95/64, pulse 102, temperature 97.8 F (36.6 C), temperature source Oral, resp. rate 27, SpO2 96.00%. on RA General: alert, cooperative, and ill-appearing HEENT: NCAT, pupils equal round and reactive to light, vision grossly intact, posterior pharynx erythematous but no exudates Neck: supple, no lymphadenopathy Lungs: scattered rhonchi L>R, very mild end expiratory wheezes, mildly increased work of expiration, no rales Chest: right  upper chest port in place Heart: tachycardic regular rate and rhythm, no murmurs, gallops, or rubs Abdomen: soft, non-tender, non-distended, normal bowel sounds Extremities: 2+ DP pulses bilaterally, no cyanosis, clubbing, or edema Neurologic: alert & oriented X3, cranial nerves II-XII intact, strength grossly intact, sensation intact to light touch   Lab results: Basic Metabolic Panel:  Recent Labs  16/10/96 2220  NA 132*  K 3.6  CL 96  CO2 24  GLUCOSE 113*  BUN 11  CREATININE 0.88  CALCIUM 8.3*   Liver Function Tests:  Recent Labs  08/05/13 2220  AST 10  ALT 8  ALKPHOS 82  BILITOT 0.5  PROT 6.3  ALBUMIN  2.9*   CBC:  Recent Labs  08/05/13 2220  WBC 2.0*  NEUTROABS 0.6*  HGB 7.8*  HCT 22.0*  MCV 84.9  PLT 96*   Cardiac Enzymes:  Recent Labs  08/05/13 2220  TROPONINI <0.30   Coagulation:  Recent Labs  08/05/13 2220  LABPROT 15.3*  INR 1.24   Urinalysis:  Recent Labs  08/05/13 2342  COLORURINE YELLOW  LABSPEC 1.014  PHURINE 6.0  GLUCOSEU NEGATIVE  HGBUR NEGATIVE  BILIRUBINUR NEGATIVE  KETONESUR NEGATIVE  PROTEINUR NEGATIVE  UROBILINOGEN 2.0*  NITRITE NEGATIVE  LEUKOCYTESUR NEGATIVE    Imaging results:  Dg Chest 2 View  08/06/2013   CLINICAL DATA:  Cough and fatigue. Mid pre syncope. History of pulmonary nodules on chest CT.  EXAM: CHEST  2 VIEW  COMPARISON:  Portable chest 08/05/2013. Abdominal CT 02/19/2011. No prior chest CT available.  FINDINGS: Right IJ Port-A-Cath is unchanged at the SVC right atrial junction. The positioning is better on the current examination. There is central airway and fissural thickening, but no evidence of hilar mass or focal pulmonary nodule. There is no pleural effusion. Mildly anterior wedging of the T10 vertebral body is unchanged from the prior CT.  IMPRESSION: Central airway and fissural thickening. No hilar mass or discrete pulmonary nodule identified.   Electronically Signed   By: Roxy Horseman   On:  08/06/2013 01:42   Dg Chest Portable 1 View  08/06/2013   CLINICAL DATA:  Sepsis. History of hypertension and cancer.  EXAM: PORTABLE CHEST - 1 VIEW  COMPARISON:  Abdominal pelvic CT 02/19/2011. No prior chest radiographs available.  FINDINGS: 2346 hr. Right IJ Port-A-Cath tip is at the level of the superior right atrium. There are low lung volumes with mild patient rotation to the left. There is some perihilar fullness on the left which may be related to the rotation. Mild central airway thickening is present bilaterally without confluent airspace opacity or pleural effusion. No osseous abnormalities are seen.  IMPRESSION: Low lung volumes with perihilar opacities bilaterally and left hilar fullness. The latter may be secondary to patient rotation; a left hilar mass cannot be excluded based on this examination. Follow up PA and lateral views recommended.   Electronically Signed   By: Roxy Horseman   On: 08/06/2013 00:03    Other results: EKG: borderline sinus tachycardia (lead reversal), repeat pending  Assessment & Plan by Problem: #Sepsis possibly secondary to URI with concern for neutropenic fever- 4/4 SIRS (temp 100.4, tachycardia 114, tachypnea 30, WBC 2). Unclear etiology but pt has been exposed to sick contacts with URI symptoms at home. Pt is immunocompromised on chemotherapy therefore must pan-culture.  Concern for neutropenic fever given ANC 540 (threshold 500 but borderline).  Portable CXR with low lung volumes with perihilar opacities bilaterally and left hilar fullness.  Repeat CXR PA and lateral showed no hilar mass or pulmonary nodules; per radiology, possible bronchitis but no evidence of infiltrate.  (CT at [facility] on 9/2 showed multiple bilateral pulmonary nodules.)  Pt received vancomcyin and Zosyn in ED per pharmacy but will transition to cefepime given concern for neutropenic fever.  Blood and urine cultures obtained before initiation of antibiotics. Pt also got 3L IVF in ED, and BPs  went from 90s systolic to 100s. Must carefully evaluate fluid status echo in 04/2013 showed EF 65% with impaired LV filling.  Lactic acid within normal limits (1.4, repeat 1.98). No anion gap. Albumin low at 2.9. Corrected Ca 9.2. Troponin x 1 negative. UA negative. -  admit to stepdown with telemetry -cefepime 2g IV q8h, vancomycin per pharmacy; continue abx until afebrile and ANC >500 (ANC is currently 540 but borderline) -IVFs NS 125 cc/hr x 12 hours then reassess fluid status -sputum culture -blood cultures x 2, urine cultures pending -consider PT/OT consult later in admission   #Hypotension- Likely due to dehydration with markedly decreased oral intake in last week. BPs responded somewhat to 3L NS in ED. Will need to monitor fluid status carefully as echo in 04/2013 showed EF 65% with impaired LV filling.  -IVF per above  #Pancytopenia with history of anemia- Obtained CBC with differential to calculate ANC, currently 540.  Pt had Hgb 10.7, WBC 8 on 9/13; now Hgb 7.8, WBC 2, Plt 96.  Likely due to chemotherapy but will check anemia panel and hemolysis labs. Normocytic anemia can be caused by malignancy.  -anemia panel (iron, TIBC, ferritin, reticulocytes, B12, folate) -LDH, haptoglobin to evaluate for hemolysis -trend CBCs  #Pleomorphic sarcoma of left leg/hip- Followed by Dr. Lin Givens at [facility].  Pt is s/p chemotherapy with cyclophosphamide, etoposide, cisplatin, doxorubicin (exact regimen unclear); scheduled for left hemipelvectomy on 10/6.  Of note, doxorubicin can cause sepsis and cardiomyopathy, cisplatin can cause thrombocytopenia, ototoxicity and nephrotoxicity. -zofran prn nausea  #RUE DVT- found on 9/18 as pt had purple swollen right arm, on Xarelto 15 mg BID.  -continue Xarelto at same dose for 21 days before transitioning to 20 mg daily  #DVT PPX- Xarelto per above  #Code status- Full code  Dispo: Disposition is deferred at this time, awaiting improvement of current medical  problems. Anticipated discharge in approximately 3-4 day(s).   The patient does not have a current PCP (No Pcp Per Patient) and does need an [facility] follow-up appointment after discharge.   Signed: Rocco Serene, MD 08/06/2013, 3:44 AM

## 2013-08-06 NOTE — ED Notes (Signed)
Pt's granddaughter informed pt has leg cancer and is receiving chemotherapy.

## 2013-08-06 NOTE — Discharge Summary (Signed)
Name: Gregory Frazier MRN: [id number] DOB: July 12, 1956 57 y.o. PCP: No Pcp Per Patient  Date of Admission: 08/05/2013  9:40 PM Date of Discharge: 08/10/2013 Attending Physician: Dr. Rogelia Boga  Discharge Diagnosis: Principal Problem:   Sepsis- w/ neutropenic fever (ANC 540) likely 2/2 URI Active Problems:   Pancytopenia   Hypotension   DVT (deep venous thrombosis) - dx on 07/29/13 and was on xarelto, but during his admission switched to lovenox   Sarcoma- L hip   Cough- 2/2 URI  Discharge Medications:   Medication List    STOP taking these medications       Rivaroxaban 15 MG Tabs tablet  Commonly known as:  XARELTO      TAKE these medications       acetaminophen 325 MG tablet  Commonly known as:  TYLENOL  Take 650 mg by mouth once.     chlorpheniramine-HYDROcodone 10-8 MG/5ML Lqcr  Commonly known as:  TUSSIONEX  Take 5 mLs by mouth every 12 (twelve) hours.     enoxaparin 80 MG/0.8ML injection  Commonly known as:  LOVENOX  Inject 0.65 mLs (65 mg total) into the skin every 12 (twelve) hours.     LORazepam 1 MG tablet  Commonly known as:  ATIVAN  Take 1 mg by mouth every 6 (six) hours as needed (nausea).     prochlorperazine 10 MG tablet  Commonly known as:  COMPAZINE  Take 10 mg by mouth every 6 (six) hours as needed (nausea).       Disposition and follow-up:   Mr.Gregory Frazier was discharged from [facility] in Stable condition.  At the hospital follow up visit please address:  1.  Pancytopenia/neutropenic fever- please assess for resolution of URI and recheck CBC; if patient still with productive cough maybe consider repeating CXR 2.  Hypokalemia- please recheck BMP and supplement as necessary  2.  Labs / imaging needed at time of follow-up: CBC, BMP  3.  Pending labs/ test needing follow-up: sputum culture  Follow-up Appointments: Follow-up Information   Follow up with [facility], MD On 08/13/2013. (2:30pm)    Specialty:   Internal Medicine   Contact information:   [address] [place] [address] [phone]       Discharge Instructions: Discharge Orders   Future Orders Complete By Expires   Call MD for:  difficulty breathing, headache or visual disturbances  As directed    Call MD for:  persistant dizziness or light-headedness  As directed    Call MD for:  persistant nausea and vomiting  As directed    Call MD for:  severe uncontrolled pain  As directed    Diet - low sodium heart healthy  As directed    Increase activity slowly  As directed       Consultations:  none  Procedures Performed:  Dg Chest 2 View  08/06/2013   CLINICAL DATA:  Cough and fatigue. Mid pre syncope. History of pulmonary nodules on chest CT.  EXAM: CHEST  2 VIEW  COMPARISON:  Portable chest 08/05/2013. Abdominal CT 02/19/2011. No prior chest CT available.  FINDINGS: Right IJ Port-A-Cath is unchanged at the SVC right atrial junction. The positioning is better on the current examination. There is central airway and fissural thickening, but no evidence of hilar mass or focal pulmonary nodule. There is no pleural effusion. Mildly anterior wedging of the T10 vertebral body is unchanged from the prior CT.  IMPRESSION: Central airway and fissural thickening. No hilar mass or  discrete pulmonary nodule identified.   Electronically Signed   By: Roxy Horseman   On: 08/06/2013 01:42   Dg Shoulder Right  07/29/2013   CLINICAL DATA:  Right shoulder pain, numbness/tingling, swelling  EXAM: RIGHT SHOULDER - 2+ VIEW  COMPARISON:  None.  FINDINGS: No fracture or dislocation is seen.  Mild spurring along the inferior glenoid rim, likely degenerative.  Visualized right lung is clear.  Right chest power port.  IMPRESSION: No acute osseous abnormality is seen.   Electronically Signed   By: Charline Bills M.D.   On: 07/29/2013 13:45   Dg Chest Portable 1 View  08/06/2013   CLINICAL DATA:  Sepsis. History of hypertension and cancer.  EXAM:  PORTABLE CHEST - 1 VIEW  COMPARISON:  Abdominal pelvic CT 02/19/2011. No prior chest radiographs available.  FINDINGS: 2346 hr. Right IJ Port-A-Cath tip is at the level of the superior right atrium. There are low lung volumes with mild patient rotation to the left. There is some perihilar fullness on the left which may be related to the rotation. Mild central airway thickening is present bilaterally without confluent airspace opacity or pleural effusion. No osseous abnormalities are seen.  IMPRESSION: Low lung volumes with perihilar opacities bilaterally and left hilar fullness. The latter may be secondary to patient rotation; a left hilar mass cannot be excluded based on this examination. Follow up PA and lateral views recommended.   Electronically Signed   By: Roxy Horseman   On: 08/06/2013 00:03   Admission HPI:  Mr. 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 Patient is only [place] speaking, granddaughter Gregory Frazier, 8 years old) was present and translating.  Patient complains of worsening cough productive of white to green sputum x 3 weeks, worst at night; denies hemoptysis. Endorses chills (but no fever), congestion, runny nose, sore throat without dysphagia. Denies associated shortness of breath, chest pain, palpitations. Of note, patient lives with his son's family and his son has been sick with a cold, two grandsons have both been coughing.  Over the past few days, patient has begun to feel weak and lightheaded. Of note, patient has had greatly decreased PO intake in the last week or so, he has not been eating much at all and has only been drinking water with medicines. He has become so weak that he can no longer stand. Patient has also been  feeling nauseated since June when chemotherapy was initiated. He had one episode of emesis yesterday, unsure of appearance, but no blood; still feels nauseated.  In terms of his recently diagnosed DVT, patient came into ED last week with purple swollen right arm. He had RUE doppler at that time which showed DVT involving the right internal jugular vein and right subclavian vein. He was prescribed Xarelto and sent home with PCP follow-up. At follow-up visit (unsure of physician's name), swelling had resolved and patient was instructed to continue taking Xarelto.  Patient reportedly had no medical problems until he was diagnosed with pleomorphic sarcoma in 04/2013. His leg pain has greatly decreased since initiation of chemotherapy. He does not take any medications at home other that some pain medicines that are sent from [place]. He is not currently in any pain.  Denies abdominal pain, change  in bowel or bladder habits. Last BM was Wednesday (9/24)- normal brown stool, no blood; denies dysuria.   Hospital Course by problem list:   #Sepsis possibly secondary to URI with concern for neutropenic fever-On admission, patient met 4/4 SIRS (temp 100.4, tachycardia 114, tachypnea 30, WBC 2). Unclear etiology but pt has been exposed to sick contacts with URI symptoms (cough, ST, nasal congestion) at home. On admission, lactic acid within normal limits (1.4, repeat 1.98). No anion gap. Troponin x 1 negative. UA negative. Pt is immunocompromised on chemotherapy therefore we pan-cultured, UCx and BCx NGTD. Concern for neutropenic fever given ANC 540. Initial portable CXR with low lung volumes with perihilar opacities bilaterally and left hilar fullness. Repeat CXR PA and lateral showed no hilar mass or pulmonary nodules; per radiology, possible bronchitis but no evidence of infiltrate. Patient received two other repeat CXR during his admission, neither of which showed a PNA (only possible bronchitis). Of note, previous CT  [facility] on 9/2 showed multiple bilateral pulmonary nodules. Pt received vancomcyin and Zosyn in ED but he was transitioned to cefepime only given concern for neutropenic fever, received a total of three days of antibiotics. BPs low on admission in 90s systolic, responded well to fluids (3L IVF in ED and continuous infusion NS for 24hrs) and were stable the duration of his hospitalization. We gave only light hydration given previous echo showing diastolic dysfunction.  Patient was initially admitted to stepdown unit, but once BP was stable, was transferred to regular floor. On morning of discharge (HD4), patient was feeling much better though cough was still present and was productive of sputum. He was afebrile with stable vital signs. He was not discharged on any antibiotics as we suspect this was a viral URI.  #Hypotension-Patient with low BPs in 90s systolic. Likely due to dehydration with markedly decreased oral intake in week prior to admission. BPs responded well to fluid resuscitation. Fluid status was carefully monitored as echo in 04/2013 showed EF 65% with impaired LV filling.   #Pancytopenia with history of anemia- Obtained CBC with differential, ANC 540 on admission. Pt had Hgb 10.7, WBC 8 on 9/13; on admission, Hgb 7.8, WBC 2, Plt 96. Likely due to chemotherapy. Anemia panel points toward anemia of chronic disease (high ferritin, normal Fe). Hgb had been decreasing, but it is 8.1 on morning of discharge which is higher than it was on admission. LDH and haptoglobin wnl so less concerning for hemolysis.   #Pleomorphic sarcoma of left leg/hip- Followed by Dr. Lin Givens at [facility]. Pt is s/p chemotherapy with cyclophosphamide, etoposide, cisplatin, doxorubicin (exact regimen unclear); scheduled for left hemipelvectomy on 10/6. Patient to follow up with his oncology physician later this week.   #RUE DVT- Diagnosed on 9/18 when pt presented with purple, swollen right arm for which he was placed on  Xarelto 15 mg BID. Since Lovenox is known to be especially effective in the treatment of VTE in cancer patients, transitioned to therapeutic lovenox on 9/26. Patient is amenable to giving himself injections at home and received lovenox training prior to discharge.   Discharge Vitals:   BP 100/62  Pulse 83  Temp(Src) 97.6 F (36.4 C) (Oral)  Resp 20  Ht 5\' 4"  (1.626 m)  Wt 64.1 kg (141 lb 5 oz)  BMI 24.24 kg/m2  SpO2 97%  Discharge Labs:  No results found for this or any previous visit (from the past 24 hour(s)).  Signed: Windell Hummingbird, MD 08/10/2013, 4:51 PM   Time Spent on  Discharge: 35 minutes Services Ordered on Discharge: none Equipment Ordered on Discharge: none

## 2013-08-06 NOTE — ED Notes (Signed)
Unable to obtain standing BP for orthostatic VS as it was not safe for the pt, pt is weak at this time.

## 2013-08-06 NOTE — Progress Notes (Signed)
Please continue telemetry.

## 2013-08-06 NOTE — Progress Notes (Signed)
Pt to TX to 6N-05, VSS, called report.

## 2013-08-06 NOTE — Progress Notes (Signed)
57yo male to change from Zosyn to cefepime for PNA vs sepsis in immunocompromised pt.  Will start cefepime 1g IV Q8H for CrCl ~80 and monitor.  Vernard Gambles, PharmD, BCPS 08/06/2013 2:02 AM

## 2013-08-06 NOTE — ED Notes (Signed)
MD at bedside. 

## 2013-08-06 NOTE — Progress Notes (Signed)
Utilization review completed.  

## 2013-08-06 NOTE — ED Notes (Signed)
Admitting MD at bedside.

## 2013-08-06 NOTE — Progress Notes (Signed)
Subjective: Patient with continued ST, productive cough. Patient's cough kept him from sleeping last night. Patient denies SOB and CP. He feels slightly hungry and has not eaten anything either this morning or last night. Patient is willing to use lovenox at home in place of xarelto given lovenox proven to be more effective in cancer patients.  Objective: Vital signs in last 24 hours: Filed Vitals:   08/06/13 0600 08/06/13 0700 08/06/13 0800 08/06/13 1100  BP: 102/84 95/59 103/62   Pulse: 96 91 94   Temp:  98 F (36.7 C)    TempSrc:  Oral    Resp: 15 19 18    Height:    5\' 4"  (1.626 m)  Weight:    64.1 kg (141 lb 5 oz)  SpO2: 98% 98% 96%    Weight change:   Intake/Output Summary (Last 24 hours) at 08/06/13 1123 Last data filed at 08/06/13 0800  Gross per 24 hour  Intake   6100 ml  Output   1300 ml  Net   4800 ml   Physical Exam General: ill appearing thin, elderly man; alert, cooperative, and in no apparent distress HEENT:NCAT, vision grossly intact Neck: supple Lungs: diffuse rhonchi throughout bilateral lung fields, R>L; normal work of breathing; port to R upper chest wall Abdomen: soft, non-tender, non-distended, normal bowel sounds Extremities: 2+ DP pulses bilaterally, no BLE edema, but there is trace pitting edema to RUE- no erythema  Skin: diaphoretic Neurologic: alert & oriented X3, cranial nerves II-XII intact, strength grossly intact, sensation intact to light touch  Lab Results: Basic Metabolic Panel:  Recent Labs Lab 08/05/13 2220 08/06/13 0408 08/06/13 0918  NA 132* 137  --   K 3.6 3.3*  --   CL 96 107  --   CO2 24 21  --   GLUCOSE 113* 105*  --   BUN 11 8  --   CREATININE 0.88 0.71  --   CALCIUM 8.3* 7.3*  --   MG  --   --  1.8   Liver Function Tests:  Recent Labs Lab 08/05/13 2220  AST 10  ALT 8  ALKPHOS 82  BILITOT 0.5  PROT 6.3  ALBUMIN 2.9*   CBC:  Recent Labs Lab 08/05/13 2220 08/06/13 0408 08/06/13 0918  WBC 2.0* 1.4*  2.0*  NEUTROABS 0.5* 0.5*  --   HGB 7.8* 6.7* 7.8*  HCT 22.0* 19.1* 22.1*  MCV 84.9 85.7 86.0  PLT 96* 91* 104*   Cardiac Enzymes:  Recent Labs Lab 08/05/13 2220  TROPONINI <0.30   Coagulation:  Recent Labs Lab 08/05/13 2220  LABPROT 15.3*  INR 1.24   Urinalysis:  Recent Labs Lab 08/05/13 2342  COLORURINE YELLOW  LABSPEC 1.014  PHURINE 6.0  GLUCOSEU NEGATIVE  HGBUR NEGATIVE  BILIRUBINUR NEGATIVE  KETONESUR NEGATIVE  PROTEINUR NEGATIVE  UROBILINOGEN 2.0*  NITRITE NEGATIVE  LEUKOCYTESUR NEGATIVE   Misc. Labs: Troponin negative x 1 Lactic acid wnl x 2 LDH wnl Haptoglobin wnl Anemia panel pending Fibrinogen 738, elevated UCx pending BCx x 2 pending  Sputum cx pending  Micro Results: Recent Results (from the past 240 hour(s))  MRSA PCR SCREENING     Status: None   Collection Time    08/06/13  4:15 AM      Result Value Range Status   MRSA by PCR NEGATIVE  NEGATIVE Final   Comment:            The GeneXpert MRSA Assay (FDA     approved for NASAL  specimens     only), is one component of a     comprehensive MRSA colonization     surveillance program. It is not     intended to diagnose MRSA     infection nor to guide or     monitor treatment for     MRSA infections.   Studies/Results: Dg Chest 2 View  08/06/2013   CLINICAL DATA:  Cough and fatigue. Mid pre syncope. History of pulmonary nodules on chest CT.  EXAM: CHEST  2 VIEW  COMPARISON:  Portable chest 08/05/2013. Abdominal CT 02/19/2011. No prior chest CT available.  FINDINGS: Right IJ Port-A-Cath is unchanged at the SVC right atrial junction. The positioning is better on the current examination. There is central airway and fissural thickening, but no evidence of hilar mass or focal pulmonary nodule. There is no pleural effusion. Mildly anterior wedging of the T10 vertebral body is unchanged from the prior CT.  IMPRESSION: Central airway and fissural thickening. No hilar mass or discrete pulmonary nodule  identified.   Electronically Signed   By: Roxy Horseman   On: 08/06/2013 01:42   Dg Chest Portable 1 View  08/06/2013   CLINICAL DATA:  Sepsis. History of hypertension and cancer.  EXAM: PORTABLE CHEST - 1 VIEW  COMPARISON:  Abdominal pelvic CT 02/19/2011. No prior chest radiographs available.  FINDINGS: 2346 hr. Right IJ Port-A-Cath tip is at the level of the superior right atrium. There are low lung volumes with mild patient rotation to the left. There is some perihilar fullness on the left which may be related to the rotation. Mild central airway thickening is present bilaterally without confluent airspace opacity or pleural effusion. No osseous abnormalities are seen.  IMPRESSION: Low lung volumes with perihilar opacities bilaterally and left hilar fullness. The latter may be secondary to patient rotation; a left hilar mass cannot be excluded based on this examination. Follow up PA and lateral views recommended.   Electronically Signed   By: Roxy Horseman   On: 08/06/2013 00:03   Medications: I have reviewed the patient's current medications. Scheduled Meds: . ceFEPime (MAXIPIME) IV  1 g Intravenous Q8H  . chlorpheniramine-HYDROcodone  5 mL Oral Q12H  . feeding supplement  237 mL Oral BID BM  . sodium chloride  3 mL Intravenous Q12H   Continuous Infusions: . sodium chloride 125 mL/hr at 08/06/13 0800   PRN Meds:.ondansetron (ZOFRAN) IV, ondansetron  Assessment/Plan: #Sepsis possibly secondary to URI with concern for neutropenic fever-On admission, patient met 4/4 SIRS (temp 100.4, tachycardia 114, tachypnea 30, WBC 2). Unclear etiology but pt has been exposed to sick contacts with URI symptoms (cough, ST, nasal congestion) at home. Concern for neutropenic fever given ANC 540. Initial portable CXR with low lung volumes with perihilar opacities bilaterally and left hilar fullness. Repeat CXR PA and lateral showed no hilar mass or pulmonary nodules; per radiology, possible bronchitis but no evidence  of infiltrate. Will repeat today given patient's lung exam revealed rhonchi throughout and he is now s/p fluid resuscitation. Pt received vancomcyin and Zosyn in ED per pharmacy but transition to cefepime given concern for neutropenic fever. Blood and urine cultures obtained before initiation of antibiotics. Pt also got 3L IVF in ED, and BPs went from 90s systolic to 100s- remaining stable in low 100s this morning. Must carefully evaluate fluid status echo in 04/2013 showed EF 65% with impaired LV filling.  -BPs stabilized today. Will transfer to medsurg floor -cefepime 2g IV q8h- continue until afebrile  and ANC >500 (ANC is currently 540 but borderline)  -IVFs NS 100 cc/hr, reassess in am  -sputum cx pending -blood cx x 2 pending -urine cx pending  -PT/OT consult perhaps tomorrow if patient feeling stronger -repeat 2 view CXR -tussionex bid   #Hypotension- Likely due to dehydration with markedly decreased oral intake in last week. Responding to fluids, though remain soft. Will need to monitor fluid status carefully as echo in 04/2013 showed EF 65% with impaired LV filling.  -IVF per above   #Pancytopenia with history of anemia- ANC 540 on admission. Pt had Hgb 10.7, WBC 8 on 9/13; on admission his Hgb 7.8, WBC 2, Plt 96. Latest CBC shows WBC 2, Hb 7.8, PLT 104. Thought to be 2/2 to chemotherapy. Checking anemia panel. LDH and haptoglobin wnl.  -f/u anemia panel (iron, TIBC, ferritin, reticulocytes, B12, folate)  -trend CBCs   #Pleomorphic sarcoma of left leg/hip- Followed by Dr. Lin Givens at [facility]. Pt is s/p chemotherapy with cyclophosphamide, etoposide, cisplatin, doxorubicin (exact regimen unclear); scheduled for left hemipelvectomy on 10/6. Of note, doxorubicin can cause sepsis and cardiomyopathy, cisplatin can cause thrombocytopenia, ototoxicity and nephrotoxicity.  -zofran prn nausea   #RUE DVT- Diagnosed on 9/18 when pt presented with purple, swollen right arm for which he was placed on  Xarelto 15 mg BID. Since lovenox is known to be especially effective in the treatment of VTE in cancer patients, we will transition to therapeutic lovenox today. Patient is amenable to giving himself injections at home.  -lovenox per pharmacy  #DVT PPX- lovenox  #Code status- Full code   Dispo: Disposition is deferred at this time, awaiting improvement of current medical problems.  Anticipated discharge in approximately 2-3 day(s).   The patient does not have a current PCP (No Pcp Per Patient) and does need an [facility] hospital follow-up appointment after discharge.  The patient does not have transportation limitations that hinder transportation to clinic appointments.  .Services Needed at time of discharge: Y = Yes, Blank = No PT:   OT:   RN:   Equipment:   Other:     LOS: 1 day   Windell Hummingbird, MD 08/06/2013, 11:23 AM

## 2013-08-07 DIAGNOSIS — R05 Cough: Secondary | ICD-10-CM

## 2013-08-07 DIAGNOSIS — I82409 Acute embolism and thrombosis of unspecified deep veins of unspecified lower extremity: Secondary | ICD-10-CM

## 2013-08-07 LAB — IRON AND TIBC
Iron: 57 ug/dL (ref 42–135)
Saturation Ratios: 33 % (ref 20–55)
TIBC: 175 ug/dL — ABNORMAL LOW (ref 215–435)

## 2013-08-07 LAB — BASIC METABOLIC PANEL
BUN: 4 mg/dL — ABNORMAL LOW (ref 6–23)
Chloride: 109 mEq/L (ref 96–112)
Creatinine, Ser: 0.62 mg/dL (ref 0.50–1.35)
GFR calc Af Amer: >90 mL/min (ref >90)
Glucose, Bld: 91 mg/dL (ref 70–99)

## 2013-08-07 LAB — CBC WITH DIFFERENTIAL/PLATELET
Basophils Absolute: 0 10*3/uL (ref 0.0–0.1)
Basophils Relative: 1 % (ref 0–1)
Eosinophils Relative: 6 % — ABNORMAL HIGH (ref 0–5)
HCT: 21.4 % — ABNORMAL LOW (ref 39.0–52.0)
Lymphocytes Relative: 25 % (ref 12–46)
Lymphs Abs: 0.6 10*3/uL — ABNORMAL LOW (ref 0.7–4.0)
MCH: 30.2 pg (ref 26.0–34.0)
MCV: 86.3 fL (ref 78.0–100.0)
Monocytes Absolute: 0.6 10*3/uL (ref 0.1–1.0)
Monocytes Relative: 25 % — ABNORMAL HIGH (ref 3–12)
Neutro Abs: 1 10*3/uL — ABNORMAL LOW (ref 1.7–7.7)
Platelets: 131 10*3/uL — ABNORMAL LOW (ref 150–400)
RDW: 15.7 % — ABNORMAL HIGH (ref 11.5–15.5)
WBC: 2.3 10*3/uL — ABNORMAL LOW (ref 4.0–10.5)

## 2013-08-07 LAB — VITAMIN B12: Vitamin B-12: >2000 pg/mL — ABNORMAL HIGH (ref 211–911)

## 2013-08-07 LAB — URINE CULTURE: Colony Count: 8000

## 2013-08-07 LAB — RETICULOCYTES
Retic Count, Absolute: 42.2 10*3/uL (ref 19.0–186.0)
Retic Ct Pct: 1.7 % (ref 0.4–3.1)

## 2013-08-07 LAB — FERRITIN: Ferritin: 674 ng/mL — ABNORMAL HIGH (ref 22–322)

## 2013-08-07 MED ORDER — SODIUM CHLORIDE 0.9 % IV SOLN: INTRAVENOUS | Status: DC

## 2013-08-07 MED ORDER — SODIUM CHLORIDE 0.9 % IV SOLN
INTRAVENOUS | Status: AC
  Administered 2013-08-07: 1000 mL via INTRAVENOUS

## 2013-08-07 MED ORDER — ACETAMINOPHEN 325 MG PO TABS
325.0000 mg | ORAL_TABLET | Freq: Four times a day (QID) | ORAL | Status: DC | PRN
  Administered 2013-08-07 – 2013-08-08 (×2): 325 mg via ORAL
  Filled 2013-08-07 (×2): qty 1

## 2013-08-07 MED ORDER — MAGNESIUM SULFATE 40 MG/ML IJ SOLN
2.0000 g | Freq: Once | INTRAMUSCULAR | Status: AC
  Administered 2013-08-07: 2 g via INTRAVENOUS
  Filled 2013-08-07: qty 50

## 2013-08-07 NOTE — Progress Notes (Signed)
Subjective: No acute events. Pt in NSR this morning. He remains afebrile. Still with productive cough and c/o pain in his throat with coughing. Denies SOB or chest pain, and states that he is feeling better since admission.  Objective: Vital signs in last 24 hours: Filed Vitals:   08/06/13 1423 08/06/13 1806 08/06/13 2155 08/07/13 0614  BP: 109/63 107/66 101/66 113/67  Pulse: 89 94 93 87  Temp: 97.8 F (36.6 C) 97.9 F (36.6 C) 97.7 F (36.5 C) 97.5 F (36.4 C)  TempSrc:   Oral Oral  Resp: 20 18 20 20   Height:      Weight:      SpO2: 100% 99% 99% 99%   Weight change: 0 lb (0 kg)  Intake/Output Summary (Last 24 hours) at 08/07/13 0839 Last data filed at 08/07/13 1610  Gross per 24 hour  Intake 945.42 ml  Output    950 ml  Net  -4.58 ml   Physical Exam General: ill appearing, thin, elderly man; alert, cooperative, and in NAD HEENT: NCAT, vision grossly intact Neck: supple Lungs: mild, diffuse rhonchi throughout bilateral lung fields; normal work of breathing; port to R upper chest wall Abdomen: soft, non-tender, non-distended, normal bowel sounds Extremities: 2+ DP pulses bilaterally, no BLE edema, but there is trace pitting edema to RUE- no erythema  Neurologic: alert & oriented X3, cranial nerves II-XII intact, non-focal  Lab Results: Basic Metabolic Panel:  Recent Labs Lab 08/06/13 0408 08/06/13 0918 08/07/13 0425  NA 137  --  142  K 3.3*  --  3.5  CL 107  --  109  CO2 21  --  23  GLUCOSE 105*  --  91  BUN 8  --  4*  CREATININE 0.71  --  0.62  CALCIUM 7.3*  --  8.0*  MG  --  1.8  --    Liver Function Tests:  Recent Labs Lab 08/05/13 2220  AST 10  ALT 8  ALKPHOS 82  BILITOT 0.5  PROT 6.3  ALBUMIN 2.9*   CBC:  Recent Labs Lab 08/06/13 0408 08/06/13 0918 08/07/13 0425  WBC 1.4* 2.0* 2.3*  NEUTROABS 0.5*  --  1.0*  HGB 6.7* 7.8* 7.5*  HCT 19.1* 22.1* 21.4*  MCV 85.7 86.0 86.3  PLT 91* 104* 131*   Cardiac Enzymes:  Recent Labs Lab  08/05/13 2220  TROPONINI <0.30   Coagulation:  Recent Labs Lab 08/05/13 2220  LABPROT 15.3*  INR 1.24   Urinalysis:  Recent Labs Lab 08/05/13 2342  COLORURINE YELLOW  LABSPEC 1.014  PHURINE 6.0  GLUCOSEU NEGATIVE  HGBUR NEGATIVE  BILIRUBINUR NEGATIVE  KETONESUR NEGATIVE  PROTEINUR NEGATIVE  UROBILINOGEN 2.0*  NITRITE NEGATIVE  LEUKOCYTESUR NEGATIVE   Misc. Labs: Troponin negative x 1 Lactic acid wnl x 2 LDH wnl Haptoglobin wnl Anemia panel pending Fibrinogen 738, elevated UCx pending BCx x 2 pending  Sputum cx pending  Micro Results: Recent Results (from the past 240 hour(s))  MRSA PCR SCREENING     Status: None   Collection Time    08/06/13  4:15 AM      Result Value Range Status   MRSA by PCR NEGATIVE  NEGATIVE Final   Comment:            The GeneXpert MRSA Assay (FDA     approved for NASAL specimens     only), is one component of a     comprehensive MRSA colonization     surveillance program. It is not  intended to diagnose MRSA     infection nor to guide or     monitor treatment for     MRSA infections.   Studies/Results: Dg Chest 2 View  08/06/2013   CLINICAL DATA:  Neutropenia. Fever.  Sarcoma.  EXAM: CHEST  2 VIEW  COMPARISON:  08/06/2013  FINDINGS: Right internal jugular Port-A-Cath remains in appropriate position. Central peribronchial thickening and mild right sided pleural thickening are stable. No evidence of acute infiltrate. No mass or lymphadenopathy identified.  IMPRESSION: No acute findings or significant interval change.   Electronically Signed   By: Myles Rosenthal   On: 08/06/2013 13:07   Dg Chest 2 View  08/06/2013   CLINICAL DATA:  Cough and fatigue. Mid pre syncope. History of pulmonary nodules on chest CT.  EXAM: CHEST  2 VIEW  COMPARISON:  Portable chest 08/05/2013. Abdominal CT 02/19/2011. No prior chest CT available.  FINDINGS: Right IJ Port-A-Cath is unchanged at the SVC right atrial junction. The positioning is better on the  current examination. There is central airway and fissural thickening, but no evidence of hilar mass or focal pulmonary nodule. There is no pleural effusion. Mildly anterior wedging of the T10 vertebral body is unchanged from the prior CT.  IMPRESSION: Central airway and fissural thickening. No hilar mass or discrete pulmonary nodule identified.   Electronically Signed   By: Roxy Horseman   On: 08/06/2013 01:42   Dg Chest Portable 1 View  08/06/2013   CLINICAL DATA:  Sepsis. History of hypertension and cancer.  EXAM: PORTABLE CHEST - 1 VIEW  COMPARISON:  Abdominal pelvic CT 02/19/2011. No prior chest radiographs available.  FINDINGS: 2346 hr. Right IJ Port-A-Cath tip is at the level of the superior right atrium. There are low lung volumes with mild patient rotation to the left. There is some perihilar fullness on the left which may be related to the rotation. Mild central airway thickening is present bilaterally without confluent airspace opacity or pleural effusion. No osseous abnormalities are seen.  IMPRESSION: Low lung volumes with perihilar opacities bilaterally and left hilar fullness. The latter may be secondary to patient rotation; a left hilar mass cannot be excluded based on this examination. Follow up PA and lateral views recommended.   Electronically Signed   By: Roxy Horseman   On: 08/06/2013 00:03   Medications: I have reviewed the patient's current medications. Scheduled Meds: . ceFEPime (MAXIPIME) IV  1 g Intravenous Q8H  . chlorpheniramine-HYDROcodone  5 mL Oral Q12H  . enoxaparin (LOVENOX) injection  65 mg Subcutaneous Q12H  . feeding supplement  237 mL Oral BID BM  . sodium chloride  3 mL Intravenous Q12H   Continuous Infusions:   PRN Meds:.ondansetron (ZOFRAN) IV, ondansetron  Assessment/Plan: #Sepsis possibly secondary to URI with concern for neutropenic fever: Resolved. On admission, patient met 4/4 SIRS (temp 100.4, tachycardia 114, tachypnea 30, WBC 2). Unclear etiology but pt  has been exposed to sick contacts with URI symptoms (cough, tachycardia, nasal congestion) at home. Concern for neutropenic fever given ANC 540. Initial portable CXR with low lung volumes with perihilar opacities bilaterally and left hilar fullness. Repeat CXR PA and lateral showed no hilar mass or pulmonary nodules; per radiology, possible bronchitis but no evidence of infiltrate. CXR repeated on 9/26 given that patient's lung exam revealed rhonchi throughout and he had been fluid resuscitation. The CXR was unchanged. Vancomcyin and Zosyn were started in the ED but was transitioned to cefepime given concern for neutropenic fever. Blood and  urine cultures were obtained before initiation of antibiotics and are pending. His BP improved after 3L IVF in ED, increasing from 90s SBP to 100s, and have remained stable at 113/67 this morning. Will continue abx x 1 more day and possibly d/c tomorrow if he continues to improve.  -cefepime 2g IV q8h- continue for 1 more day  -sputum cx pending -blood cx x 2 pending -urine cx pending  -PT/OT consult  -tussionex bid   #Hypotension: Resolved. Likely due to dehydration with markedly decreased oral intake in the past week. Responded to fluids. Now since with increased po intake, stopping IVF, as echo in 04/2013 showed EF 65% with impaired LV filling.   #Pancytopenia with history of anemia: ANC 540 on admission. Pt had Hgb 10.7, WBC 8 on 9/13; on admission his Hgb 7.8, WBC 2, Plt 96. Latest CBC shows WBC 2, Hb 7.8, PLT 104. Thought to be 2/2 to chemotherapy. Checking anemia panel. LDH and haptoglobin wnl.  -f/u anemia panel (iron, TIBC, ferritin, reticulocytes, B12, folate)  -trend CBCs   #Pleomorphic sarcoma of left leg/hip: Followed by Dr. Lin G[facility]ens at [facility]. Pt is s/p chemotherapy with cyclophosphamide, etoposide, cisplatin, doxorubicin (exact regimen unclear); scheduled for left hemipelvectomy on 10/6. Of note, doxorubicin can cause sepsis and cardiomyopathy, cisplatin  can cause thrombocytopenia, ototoxicity and nephrotoxicity.  -zofran prn nausea   #RUE DVT: Diagnosed on 9/18 when pt presented with purple, swollen right arm for which he was placed on Xarelto 15 mg BID. Since Lovenox is known to be especially effective in the treatment of VTE in cancer patients, transitioned to therapeutic lovenox on 9/26. Patient is amenable to giving himself injections at home.  -lovenox per pharmacy  #DVT PPX: On therapeutic Lovenox  #Code status: Full code   Dispo: Disposition is deferred at this time, awaiting improvement of current medical problems.  Anticipated discharge in approximately 1-2 day(s).   The patient does not have a current PCP (No Pcp Per Patient) and does possibly need an [facility] hospital follow-up appointment after discharge.  The patient does not have transportation limitations that hinder transportation to clinic appointments.  .Services Needed at time of discharge: Y = Yes, Blank = No PT:   OT:   RN:   Equipment:   Other:     LOS: 2 days   Genelle Gather, MD 08/07/2013, 8:39 AM

## 2013-08-08 ENCOUNTER — Inpatient Hospital Stay (HOSPITAL_COMMUNITY): Payer: Medicaid Other

## 2013-08-08 DIAGNOSIS — C499 Malignant neoplasm of connective and soft tissue, unspecified: Secondary | ICD-10-CM

## 2013-08-08 DIAGNOSIS — R5381 Other malaise: Secondary | ICD-10-CM

## 2013-08-08 LAB — CBC WITH DIFFERENTIAL/PLATELET
Basophils Absolute: 0 10*3/uL (ref 0.0–0.1)
Basophils Relative: 1 % (ref 0–1)
Eosinophils Absolute: 0.1 10*3/uL (ref 0.0–0.7)
HCT: 21.5 % — ABNORMAL LOW (ref 39.0–52.0)
Lymphocytes Relative: 26 % (ref 12–46)
Lymphs Abs: 0.7 10*3/uL (ref 0.7–4.0)
MCH: 30 pg (ref 26.0–34.0)
MCHC: 34.4 g/dL (ref 30.0–36.0)
Monocytes Absolute: 0.6 10*3/uL (ref 0.1–1.0)
Neutro Abs: 1.3 10*3/uL — ABNORMAL LOW (ref 1.7–7.7)
Platelets: 202 10*3/uL (ref 150–400)
RDW: 15.9 % — ABNORMAL HIGH (ref 11.5–15.5)

## 2013-08-08 LAB — BASIC METABOLIC PANEL
BUN: <3 mg/dL — ABNORMAL LOW (ref 6–23)
CO2: 24 mEq/L (ref 19–32)
Calcium: 8.2 mg/dL — ABNORMAL LOW (ref 8.4–10.5)
Creatinine, Ser: 0.5 mg/dL (ref 0.50–1.35)
GFR calc non Af Amer: >90 mL/min (ref >90)
Glucose, Bld: 91 mg/dL (ref 70–99)
Sodium: 140 mEq/L (ref 135–145)

## 2013-08-08 MED ORDER — POTASSIUM CHLORIDE CRYS ER 20 MEQ PO TBCR
40.0000 meq | EXTENDED_RELEASE_TABLET | Freq: Two times a day (BID) | ORAL | Status: AC
  Administered 2013-08-08 (×2): 40 meq via ORAL
  Filled 2013-08-08 (×2): qty 2

## 2013-08-08 NOTE — Progress Notes (Signed)
Subjective: No acute events. Pt remaining in NSR. He remains afebrile. Still with productive cough that he states is not better since admission, with purulent sputum. He states that he feels bad this morning, no better since yesterday. Denies SOB or chest pain.  Objective: Vital signs in last 24 hours: Filed Vitals:   08/06/13 2155 08/07/13 0614 08/07/13 2124 08/08/13 0548  BP: 101/66 113/67 119/65 120/70  Pulse: 93 87 93 76  Temp: 97.7 F (36.5 C) 97.5 F (36.4 C) 97.4 F (36.3 C) 98.3 F (36.8 C)  TempSrc: Oral Oral Oral   Resp: 20 20 19 18   Height:      Weight:      SpO2: 99% 99% 97% 97%   Weight change:   Intake/Output Summary (Last 24 hours) at 08/08/13 1207 Last data filed at 08/08/13 0549  Gross per 24 hour  Intake    737 ml  Output   1200 ml  Net   -463 ml   Physical Exam General: ill appearing, thin, elderly man; alert, cooperative, and in NAD HEENT: NCAT, vision grossly intact Neck: supple Lungs: Breath sounds clear, much improved; normal work of breathing Abdomen: soft, non-tender, non-distended, normal bowel sounds Extremities: 2+ DP pulses bilaterally, no BLE edema, but there is trace pitting edema to RUE- no erythema  Skin: Port in R upper chest wall w/ erythema or induration, nontender Neurologic: alert & oriented X3, cranial nerves II-XII intact, non-focal  Lab Results: Basic Metabolic Panel:  Recent Labs Lab 08/06/13 0918 08/07/13 0425 08/08/13 0404  NA  --  142 140  K  --  3.5 3.0*  CL  --  109 105  CO2  --  23 24  GLUCOSE  --  91 91  BUN  --  4* <3*  CREATININE  --  0.62 0.50  CALCIUM  --  8.0* 8.2*  MG 1.8  --   --    Liver Function Tests:  Recent Labs Lab 08/05/13 2220  AST 10  ALT 8  ALKPHOS 82  BILITOT 0.5  PROT 6.3  ALBUMIN 2.9*   CBC:  Recent Labs Lab 08/07/13 0425 08/08/13 0404  WBC 2.3* 2.7*  NEUTROABS 1.0* 1.3*  HGB 7.5* 7.4*  HCT 21.4* 21.5*  MCV 86.3 87.0  PLT 131* 202   Cardiac Enzymes:  Recent  Labs Lab 08/05/13 2220  TROPONINI <0.30   Coagulation:  Recent Labs Lab 08/05/13 2220  LABPROT 15.3*  INR 1.24   Urinalysis:  Recent Labs Lab 08/05/13 2342  COLORURINE YELLOW  LABSPEC 1.014  PHURINE 6.0  GLUCOSEU NEGATIVE  HGBUR NEGATIVE  BILIRUBINUR NEGATIVE  KETONESUR NEGATIVE  PROTEINUR NEGATIVE  UROBILINOGEN 2.0*  NITRITE NEGATIVE  LEUKOCYTESUR NEGATIVE   Misc. Labs: Troponin negative x 1 Lactic acid wnl x 2 LDH wnl Haptoglobin wnl Anemia panel pending Fibrinogen 738, elevated UCx pending BCx x 2 pending  Sputum cx pending  Micro Results: Recent Results (from the past 240 hour(s))  CULTURE, BLOOD (ROUTINE X 2)     Status: None   Collection Time    08/05/13 10:25 PM      Result Value Range Status   Specimen Description BLOOD HAND RIGHT   Final   Special Requests BOTTLES DRAWN AEROBIC AND ANAEROBIC 10CC   Final   Culture  Setup Time     Final   Value: 08/06/2013 04:33     Performed at [facility]   Culture     Final   Value:  BLOOD CULTURE RECEIVED NO GROWTH TO DATE CULTURE WILL BE HELD FOR 5 DAYS BEFORE ISSUING A FINAL NEGATIVE REPORT     Performed at [facility]   Report Status PENDING   Incomplete  CULTURE, BLOOD (ROUTINE X 2)     Status: None   Collection Time    08/05/13 10:47 PM      Result Value Range Status   Specimen Description BLOOD ARM LEFT   Final   Special Requests BOTTLES DRAWN AEROBIC ONLY 6CC   Final   Culture  Setup Time     Final   Value: 08/06/2013 04:35     Performed at [facility]   Culture     Final   Value:        BLOOD CULTURE RECEIVED NO GROWTH TO DATE CULTURE WILL BE HELD FOR 5 DAYS BEFORE ISSUING A FINAL NEGATIVE REPORT     Performed at [facility]   Report Status PENDING   Incomplete  URINE CULTURE     Status: None   Collection Time    08/05/13 11:42 PM      Result Value Range Status   Specimen Description URINE, RANDOM   Final   Special Requests NONE   Final   Culture   Setup Time     Final   Value: 08/05/2013 23:57     Performed at [facility] Count     Final   Value: 8,000 COLONIES/ML     Performed at [facility]   Culture     Final   Value: INSIGNIFICANT GROWTH     Performed at [facility]   Report Status 08/07/2013 FINAL   Final  MRSA PCR SCREENING     Status: None   Collection Time    08/06/13  4:15 AM      Result Value Range Status   MRSA by PCR NEGATIVE  NEGATIVE Final   Comment:            The GeneXpert MRSA Assay (FDA     approved for NASAL specimens     only), is one component of a     comprehensive MRSA colonization     surveillance program. It is not     intended to diagnose MRSA     infection nor to guide or     monitor treatment for     MRSA infections.   Studies/Results: No results found. Medications: I have reviewed the patient's current medications. Scheduled Meds: . ceFEPime (MAXIPIME) IV  1 g Intravenous Q8H  . chlorpheniramine-HYDROcodone  5 mL Oral Q12H  . enoxaparin (LOVENOX) injection  65 mg Subcutaneous Q12H  . feeding supplement  237 mL Oral BID BM  . sodium chloride  3 mL Intravenous Q12H   Continuous Infusions:   PRN Meds:.acetaminophen, ondansetron (ZOFRAN) IV, ondansetron  Assessment/Plan: #Sepsis possibly secondary to URI with concern for neutropenic fever: Resolved. On admission, patient met 4/4 SIRS (temp 100.4, tachycardia 114, tachypnea 30, WBC 2). Unclear etiology but pt has been exposed to sick contacts with URI symptoms (cough, tachycardia, nasal congestion) at home. Concern for neutropenic fever given ANC 540. Initial portable CXR with low lung volumes with perihilar opacities bilaterally and left hilar fullness. Repeat CXR PA and lateral showed no hilar mass or pulmonary nodules; per radiology, possible bronchitis but no evidence of infiltrate. CXR repeated on 9/26 given that patient's lung exam revealed rhonchi throughout and he had been fluid resuscitation. The CXR  was unchanged. Vancomcyin and Zosyn were started in the ED but was transitioned to cefepime given concern for neutropenic fever. Blood and urine cultures were obtained before initiation of antibiotics and are pending. His BP improved after 3L IVF in ED, increasing from 90s SBP to 120s, and have remained stable. With his persistent productive cough with purulent looking sputum, continuing the IV abx and getting a repeat CXR. If CXR unchanged, will change to po abx and possibly d/c tomorrow if he improves. All cx NGTD - Cefepime 2g IV q8h - CXR repeat - sputum cx pending - blood cx x 2 pending - urine cx pending  - PT/OT consult  - tussionex bid   #Hypotension: Resolved. Likely due to dehydration with markedly decreased oral intake in the past week. Responded to fluids. Now since with increased po intake, stopping IVF, as echo in 04/2013 showed EF 65% with impaired LV filling.   #Pancytopenia with history of anemia: ANC 540 on admission. Pt had Hgb 10.7, WBC 8 on 9/13; on admission his Hgb 7.8, WBC 2, Plt 96. Latest CBC shows WBC 2, Hb 7.8, PLT 104. Thought to be 2/2 to chemotherapy. Anemia panel points toward anemia of inflammation. Hgb decreasing, but LDH and haptoglobin wnl so less concerning for hemolysis.  - AM CBC   #Pleomorphic sarcoma of left leg/hip: Followed by Dr. Lin Givens at [facility]. Pt is s/p chemotherapy with cyclophosphamide, etoposide, cisplatin, doxorubicin (exact regimen unclear); scheduled for left hemipelvectomy on 10/6. Of note, doxorubicin can cause sepsis and cardiomyopathy, cisplatin can cause thrombocytopenia, ototoxicity and nephrotoxicity.  -zofran prn nausea   #RUE DVT: Diagnosed on 9/18 when pt presented with purple, swollen right arm for which he was placed on Xarelto 15 mg BID. Since Lovenox is known to be especially effective in the treatment of VTE in cancer patients, transitioned to therapeutic lovenox on 9/26. Patient is amenable to giving himself injections at home.   -lovenox per pharmacy  #DVT PPX: On therapeutic Lovenox  #Code status: Full code   Dispo: Disposition is deferred at this time, awaiting improvement of current medical problems.  Anticipated discharge in approximately 1-2 day(s).   The patient does not have a current PCP (No Pcp Per Patient) and does possibly need an [facility] hospital follow-up appointment after discharge.  The patient does not have transportation limitations that hinder transportation to clinic appointments.  .Services Needed at time of discharge: Y = Yes, Blank = No PT:   OT:   RN:   Equipment:   Other:     LOS: 3 days   Genelle Gather, MD 08/08/2013, 12:07 PM

## 2013-08-08 NOTE — Evaluation (Signed)
Physical Therapy Evaluation Patient Details Name: Gregory Frazier MRN: [id number] DOB: April 29, 1956 Today's Date: 08/08/2013 Time: 4098-1191 PT Time Calculation (min): 25 min  PT Assessment / Plan / Recommendation History of Present Illness  Mr. Gregory Frazier is a 57 year old man with history of pleomorphic sarcoma in left leg (s/p chemo with last treatment 2 weeks ago, scheduled for left hemipelvectomy on 08/16/13 at [facility]), RUE DVT found on 9/18 now on Xarelto, who presents with productive cough x 3 weeks and lightheadedness x several days.  Clinical Impression  Pt admitted with above. Pt currently with functional limitations due to the deficits listed below (see PT Problem List).  Pt will benefit from skilled PT to increase their independence and safety with mobility to allow discharge to the venue listed below.       PT Assessment  Patient needs continued PT services    Follow Up Recommendations  Supervision/Assistance - 24 hour (Will need more therapy post scheduled hemipelvectomy)    Does the patient have the potential to tolerate intense rehabilitation      Barriers to Discharge Decreased caregiver support Unsure how much help is available to pt at home    Equipment Recommendations  3in1 (PT)    Recommendations for Other Services     Frequency Min 3X/week    Precautions / Restrictions Precautions Precautions: Other (comment) (Neutropenic) Precaution Comments: Neutropenic prec   Pertinent Vitals/Pain 4/10 LLE pain with walking, incr to a 6/10 with incr distance patient repositioned for comfort       Mobility  Bed Mobility Bed Mobility: Supine to Sit;Sitting - Scoot to Edge of Bed Supine to Sit: 5: Supervision Sitting - Scoot to Edge of Bed: 5: Supervision Details for Bed Mobility Assistance: Overall smooth motion Transfers Transfers: Sit to Stand;Stand to Sit Sit to Stand: 5: Supervision Stand to Sit: 5: Supervision Details for Transfer Assistance: Cues  to control descent to chair Ambulation/Gait Ambulation/Gait Assistance: 4: Min guard;5: Supervision Ambulation Distance (Feet): 300 Feet Assistive device: Rolling walker Ambulation/Gait Assistance Details: Cues to push down into RW to unweigh painful LLE Gait Pattern: Step-through pattern    Exercises     PT Diagnosis: Difficulty walking;Acute pain  PT Problem List: Decreased activity tolerance;Decreased balance;Decreased mobility;Pain PT Treatment Interventions: DME instruction;Gait training;Stair training;Functional mobility training;Therapeutic activities;Therapeutic exercise;Patient/family education     PT Goals(Current goals can be found in the care plan section) Acute Rehab PT Goals Patient Stated Goal: did not state PT Goal Formulation: With patient Time For Goal Achievement: 08/15/13 Potential to Achieve Goals: Good  Visit Information  Last PT Received On: 08/08/13 Assistance Needed: +1 History of Present Illness: Mr. Gregory Frazier is a 57 year old man with history of pleomorphic sarcoma in left leg (s/p chemo with last treatment 2 weeks ago, scheduled for left hemipelvectomy on 08/16/13 at [facility]), RUE DVT found on 9/18 now on Xarelto, who presents with productive cough x 3 weeks and lightheadedness x several days.       Prior Functioning  Home Living Family/patient expects to be discharged to:: Private residence Living Arrangements:  (Need clarification) Available Help at Discharge: Family;Available PRN/intermittently (need clarification) Type of Home: House Home Access: Stairs to enter [employer] of Steps: 5 Entrance Stairs-Rails: Right;Left;Can reach both Home Layout: One level Home Equipment: Crutches;Walker - 2 wheels Prior Function Level of Independence: Independent with assistive device(s) Communication Communication: Prefers language other than English    Cognition  Cognition Arousal/Alertness: Awake/alert Behavior During Therapy: WFL for  tasks assessed/performed Overall Cognitive Status:  Within Functional Limits for tasks assessed    Extremity/Trunk Assessment Upper Extremity Assessment Upper Extremity Assessment: Overall WFL for tasks assessed Lower Extremity Assessment Lower Extremity Assessment: Overall WFL for tasks assessed (Though eith LLE pain)   Balance    End of Session PT - End of Session Activity Tolerance: Patient tolerated treatment well Patient left: in chair;with call bell/phone within reach;with family/visitor present Nurse Communication: Mobility status  GP     Van Clines [facility] [place], [place] [phone]  08/08/2013, 5:48 PM

## 2013-08-09 LAB — BASIC METABOLIC PANEL
BUN: <3 mg/dL — ABNORMAL LOW (ref 6–23)
Calcium: 8.8 mg/dL (ref 8.4–10.5)
Creatinine, Ser: 0.48 mg/dL — ABNORMAL LOW (ref 0.50–1.35)
GFR calc Af Amer: >90 mL/min (ref >90)
GFR calc non Af Amer: >90 mL/min (ref >90)
Glucose, Bld: 90 mg/dL (ref 70–99)

## 2013-08-09 LAB — CBC WITH DIFFERENTIAL/PLATELET
Basophils Absolute: 0 10*3/uL (ref 0.0–0.1)
Basophils Relative: 1 % (ref 0–1)
Eosinophils Absolute: 0.1 10*3/uL (ref 0.0–0.7)
Hemoglobin: 8.1 g/dL — ABNORMAL LOW (ref 13.0–17.0)
Lymphocytes Relative: 20 % (ref 12–46)
MCH: 29.8 pg (ref 26.0–34.0)
MCHC: 34.5 g/dL (ref 30.0–36.0)
MCV: 86.4 fL (ref 78.0–100.0)
Monocytes Absolute: 0.8 10*3/uL (ref 0.1–1.0)
Monocytes Relative: 20 % — ABNORMAL HIGH (ref 3–12)
Neutro Abs: 2.2 10*3/uL (ref 1.7–7.7)
Neutrophils Relative %: 56 % (ref 43–77)
RDW: 16 % — ABNORMAL HIGH (ref 11.5–15.5)

## 2013-08-09 MED ORDER — POTASSIUM CHLORIDE CRYS ER 20 MEQ PO TBCR
40.0000 meq | EXTENDED_RELEASE_TABLET | Freq: Once | ORAL | Status: AC
  Administered 2013-08-09: 40 meq via ORAL
  Filled 2013-08-09: qty 2

## 2013-08-09 MED ORDER — HYDROCOD POLST-CHLORPHEN POLST 10-8 MG/5ML PO LQCR: 5.0000 mL | Freq: Two times a day (BID) | ORAL | Status: DC

## 2013-08-09 MED ORDER — ENOXAPARIN SODIUM 80 MG/0.8ML ~~LOC~~ SOLN: 65.0000 mg | Freq: Two times a day (BID) | SUBCUTANEOUS | Status: DC

## 2013-08-09 NOTE — Progress Notes (Signed)
Pharmacy asked to provide Lovenox education prior to discharge.  Relative was present in the room along with our interpreter Graciela.  The following points were explained to patient and relative:  - indication - dose - frequency - side effects - missed dose - DDI - technique/where to inject - discard  Patient and relative verbalized understanding.   Kaymarie Wynn D. Laney Potash, PharmD, BCPS Pager:  [phone] 08/09/2013, 3:20 PM

## 2013-08-09 NOTE — Progress Notes (Signed)
I have reviewed this note and agree with all findings. [name], PT, DPT Pager: [phone]   

## 2013-08-09 NOTE — Progress Notes (Signed)
Interpreter Wyvonnia Dusky for pharmacist Larita Fife

## 2013-08-09 NOTE — Progress Notes (Signed)
Discharge Note. Discharge instructions reviewed with pt and family with use of Spanish interpreter. Pt asked appropriate questions about medications. Medications and follow up appointment reviewed. Pt stated that he is ready to go home.

## 2013-08-09 NOTE — Progress Notes (Signed)
Subjective: No acute events. Pt remaining in NSR. He remains afebrile. Patient notes much improvement of his URI symptoms. Still with productive cough, though notes improvement today especially with tussionex. He feels ready to go home.   Objective: Vital signs in last 24 hours: Filed Vitals:   08/08/13 0548 08/08/13 1354 08/08/13 2138 08/09/13 0536  BP: 120/70 102/52 110/62 113/74  Pulse: 76 74 80 82  Temp: 98.3 F (36.8 C) 97.4 F (36.3 C) 97.3 F (36.3 C) 98.1 F (36.7 C)  TempSrc:  Oral Oral   Resp: 18 17 18 17   Height:      Weight:      SpO2: 97% 100% 98% 98%   Weight change:   Intake/Output Summary (Last 24 hours) at 08/09/13 1233 Last data filed at 08/09/13 0900  Gross per 24 hour  Intake   2229 ml  Output   1700 ml  Net    529 ml   Physical Exam General: thin, elderly man; alert, cooperative, and in NAD HEENT: NCAT, vision grossly intact Neck: supple Lungs: Breath sounds clear, much improved; normal work of breathing Abdomen: soft, non-tender, non-distended, normal bowel sounds Extremities: 2+ DP pulses bilaterally, no BLE edema,1+ pitting edema to RUE- no erythema  Skin: Port in R upper chest wall w/ erythema or induration, nontender Neurologic: alert & oriented X3, cranial nerves II-XII intact, non-focal  Lab Results: Basic Metabolic Panel:  Recent Labs Lab 08/06/13 0918  08/08/13 0404 08/09/13 0553  NA  --   < > 140 140  K  --   < > 3.0* 3.4*  CL  --   < > 105 104  CO2  --   < > 24 25  GLUCOSE  --   < > 91 90  BUN  --   < > <3* <3*  CREATININE  --   < > 0.50 0.48*  CALCIUM  --   < > 8.2* 8.8  MG 1.8  --   --   --   < > = values in this interval not displayed. Liver Function Tests:  Recent Labs Lab 08/05/13 2220  AST 10  ALT 8  ALKPHOS 82  BILITOT 0.5  PROT 6.3  ALBUMIN 2.9*   CBC:  Recent Labs Lab 08/08/13 0404 08/09/13 0553  WBC 2.7* 3.9*  NEUTROABS 1.3* 2.2  HGB 7.4* 8.1*  HCT 21.5* 23.5*  MCV 87.0 86.4  PLT 202 269    Cardiac Enzymes:  Recent Labs Lab 08/05/13 2220  TROPONINI <0.30   Coagulation:  Recent Labs Lab 08/05/13 2220  LABPROT 15.3*  INR 1.24   Urinalysis:  Recent Labs Lab 08/05/13 2342  COLORURINE YELLOW  LABSPEC 1.014  PHURINE 6.0  GLUCOSEU NEGATIVE  HGBUR NEGATIVE  BILIRUBINUR NEGATIVE  KETONESUR NEGATIVE  PROTEINUR NEGATIVE  UROBILINOGEN 2.0*  NITRITE NEGATIVE  LEUKOCYTESUR NEGATIVE   Misc. Labs: Troponin negative x 1 Lactic acid wnl x 2 LDH wnl Haptoglobin wnl Anemia panel pending Fibrinogen 738, elevated UCx pending BCx x 2 pending  Sputum cx pending  Micro Results: Recent Results (from the past 240 hour(s))  CULTURE, BLOOD (ROUTINE X 2)     Status: None   Collection Time    08/05/13 10:25 PM      Result Value Range Status   Specimen Description BLOOD HAND RIGHT   Final   Special Requests BOTTLES DRAWN AEROBIC AND ANAEROBIC 10CC   Final   Culture  Setup Time     Final   Value: 08/06/2013  04:33     Performed at [facility]     Final   Value:        BLOOD CULTURE RECEIVED NO GROWTH TO DATE CULTURE WILL BE HELD FOR 5 DAYS BEFORE ISSUING A FINAL NEGATIVE REPORT     Performed at [facility]   Report Status PENDING   Incomplete  CULTURE, BLOOD (ROUTINE X 2)     Status: None   Collection Time    08/05/13 10:47 PM      Result Value Range Status   Specimen Description BLOOD ARM LEFT   Final   Special Requests BOTTLES DRAWN AEROBIC ONLY 6CC   Final   Culture  Setup Time     Final   Value: 08/06/2013 04:35     Performed at [facility]   Culture     Final   Value:        BLOOD CULTURE RECEIVED NO GROWTH TO DATE CULTURE WILL BE HELD FOR 5 DAYS BEFORE ISSUING A FINAL NEGATIVE REPORT     Performed at [facility]   Report Status PENDING   Incomplete  URINE CULTURE     Status: None   Collection Time    08/05/13 11:42 PM      Result Value Range Status   Specimen Description URINE, RANDOM   Final   Special  Requests NONE   Final   Culture  Setup Time     Final   Value: 08/05/2013 23:57     Performed at [facility] Count     Final   Value: 8,000 COLONIES/ML     Performed at [facility]   Culture     Final   Value: INSIGNIFICANT GROWTH     Performed at [facility]   Report Status 08/07/2013 FINAL   Final  MRSA PCR SCREENING     Status: None   Collection Time    08/06/13  4:15 AM      Result Value Range Status   MRSA by PCR NEGATIVE  NEGATIVE Final   Comment:            The GeneXpert MRSA Assay (FDA     approved for NASAL specimens     only), is one component of a     comprehensive MRSA colonization     surveillance program. It is not     intended to diagnose MRSA     infection nor to guide or     monitor treatment for     MRSA infections.   Studies/Results: Dg Chest 2 View  08/08/2013   CLINICAL DATA:  57 year old male with productive cough. Respiratory infection. History of sarcoma. Subsequent encounter.  EXAM: CHEST  2 VIEW  COMPARISON:  08/06/2013 and earlier.  FINDINGS: Stable dual lumen right chest porta cath. Stable lung volumes. Visualized tracheal air column is within normal limits. No pneumothorax, pulmonary edema, or acute pulmonary opacity. There are small bilateral pleural effusions, new since 0129 hr on 08/06/2013. Stable cardiac size and mediastinal contours. Stable visualized osseous structures.  IMPRESSION: Small bilateral pleural effusions. No other acute cardiopulmonary abnormality.   Electronically Signed   By: Augusto Gamble M.D.   On: 08/08/2013 16:51   Medications: I have reviewed the patient's current medications. Scheduled Meds: . chlorpheniramine-HYDROcodone  5 mL Oral Q12H  . enoxaparin (LOVENOX) injection  65 mg Subcutaneous Q12H  . feeding supplement  237 mL Oral BID BM  .  sodium chloride  3 mL Intravenous Q12H   Continuous Infusions:   PRN Meds:.acetaminophen, ondansetron (ZOFRAN) IV,  ondansetron  Assessment/Plan: #Sepsis possibly secondary to URI with concern for neutropenic fever: Resolved. On admission, patient met 4/4 SIRS (temp 100.4, tachycardia 114, tachypnea 30, WBC 2). Unclear etiology but pt has been exposed to sick contacts with URI symptoms (cough, tachycardia, nasal congestion) at home. Concern for neutropenic fever given ANC 540. Initial portable CXR with low lung volumes with perihilar opacities bilaterally and left hilar fullness. Repeat CXR PA and lateral showed no hilar mass or pulmonary nodules; per radiology, possible bronchitis but no evidence of infiltrate. CXR repeated on 9/26 given that patient's lung exam revealed rhonchi throughout and he had been fluid resuscitation. The CXR was unchanged. Vancomcyin and Zosyn were started in the ED but was transitioned to cefepime given concern for neutropenic fever. Blood and urine cultures were obtained before initiation of antibiotics and show NGTD. His BP improved after 3L IVF in ED, increasing from 90s SBP to 120s, and have remained stable. CXR yesterday showed no pna and pt feels subjectively much improved. Will discharge today with close follow up with oncology at [facility].  - Cefepime 2g IV q8h- stopped today (Day 3) - sputum cx pending - blood cx NGTD - urine cx NGTD - PT/OT consult - recommend 3 in 1 bedside commode with 24hr home assistance. - tussionex bid - will discharge with this -K low today at 3.4- will supp before discharge  #Hypotension: Resolved. Likely due to dehydration with markedly decreased oral intake in the past week. Responded to fluids. Now since with increased po intake, stopping IVF, as echo in 04/2013 showed EF 65% with impaired LV filling.   #Pancytopenia with history of anemia: ANC 540 on admission. Pt had Hgb 10.7, WBC 8 on 9/13; on admission his Hgb 7.8, WBC 2, Plt 96. Latest CBC shows WBC 3.9, Hb 8.1, PLT 269, which are much improved. Thought to be 2/2 to chemotherapy. Anemia panel points  toward anemia of chronic disease (high ferritin, normal Fe). Hgb had been decreasing, but it is 8.1 today which is higher than it was on admission. LDH and haptoglobin wnl so less concerning for hemolysis.   #Pleomorphic sarcoma of left leg/hip: Followed by Dr. Lin Givens at [facility]. Pt is s/p chemotherapy with cyclophosphamide, etoposide, cisplatin, doxorubicin (exact regimen unclear); scheduled for left hemipelvectomy on 10/6. Of note, doxorubicin can cause sepsis and cardiomyopathy, cisplatin can cause thrombocytopenia, ototoxicity and nephrotoxicity.  -zofran prn nausea  -will f/u with Dr. Lin Givens on Friday this week  #RUE DVT: Diagnosed on 9/18 when pt presented with purple, swollen right arm for which he was placed on Xarelto 15 mg BID. Since Lovenox is known to be especially effective in the treatment of VTE in cancer patients, transitioned to therapeutic lovenox on 9/26. Patient is amenable to giving himself injections at home.  -lovenox per pharmacy -pt to receive lovenox education prior to discharge  #DVT PPX: On therapeutic Lovenox  #Code status: Full code   Dispo: Disposition is deferred at this time, awaiting improvement of current medical problems.  Anticipated discharge in approximately 1-2 day(s).   The patient does not have a current PCP (No Pcp Per Patient) and does possibly need an [facility] hospital follow-up appointment after discharge.  The patient does not have transportation limitations that hinder transportation to clinic appointments.  .Services Needed at time of discharge: Y = Yes, Blank = No PT:   OT:   RN:  Equipment:   Other:     LOS: 4 days   Windell Hummingbird, MD 08/09/2013, 12:33 PM

## 2013-08-09 NOTE — Progress Notes (Signed)
Physical Therapy Treatment Patient Details Name: Gregory Frazier MRN: [id number] DOB: Oct 19, 1956 Today's Date: 08/09/2013 Time: 8469-6295 PT Time Calculation (min): 24 min  PT Assessment / Plan / Recommendation  History of Present Illness Gregory Frazier is a 57 year old man with history of pleomorphic sarcoma in left leg (s/p chemo with last treatment 2 weeks ago, scheduled for left hemipelvectomy on 08/16/13 at [facility] RUE DVT found on 9/18 now on Xarelto, who presents with productive cough x 3 weeks and lightheadedness x several days.   PT Comments   Pt demonstrating progress as he was able to ambulate with RW and traverse 10 steps today. Granddaughter present and interpreted for pt, granddaughter states she will be present 24/7 at home with pt to provide assist. Pt would continue to benefit from skilled PT in order to improve functional mobility and safety.  Follow Up Recommendations  Supervision for mobility/OOB     Does the patient have the potential to tolerate intense rehabilitation     Barriers to Discharge        Equipment Recommendations  3in1 (PT)    Recommendations for Other Services    Frequency Min 3X/week   Progress towards PT Goals Progress towards PT goals: Progressing toward goals  Plan Current plan remains appropriate    Precautions / Restrictions Precautions Precautions: Other (comment) (Neutropenic) Precaution Comments: Neutropenic prec Restrictions Weight Bearing Restrictions: No   Pertinent Vitals/Pain No c/o of pain at rest, however, pt reports 4/10 LLE pain during ambulation. Pt took seated rest break during ambulation and positioned to comfort at end of session.    Mobility  Bed Mobility Bed Mobility: Supine to Sit;Sitting - Scoot to Edge of Bed Supine to Sit: 5: Supervision;With rails Sitting - Scoot to Edge of Bed: 5: Supervision;With rail Sit to Supine: 5: Supervision Details for Bed Mobility Assistance: Supervision to ensure  safety. Transfers Transfers: Sit to Stand;Stand to Sit Sit to Stand: 5: Supervision;From bed;From chair/3-in-1;With upper extremity assist;With armrests Stand to Sit: 5: Supervision;To bed;4: Min guard;To chair/3-in-1;With upper extremity assist;With armrests Details for Transfer Assistance: Supervision for safety and VC's for proper hand placement. sit<>stand to/from bed performed x2 for hand placement. Ambulation/Gait Ambulation/Gait Assistance: 4: Min guard Ambulation Distance (Feet): 300 Feet Assistive device: Rolling walker Ambulation/Gait Assistance Details: Min guard to ensure safety with pt progessing to supervision during last 100' of amb. VC's to stay within RW and to improve upright posture. Gait Pattern: Step-through pattern;Decreased stride length;Decreased stance time - left Gait velocity: Decreased Stairs: Yes Stairs Assistance: 4: Min guard Stairs Assistance Details (indicate cue type and reason): Pt able to ascend/descend 10 steps in step to pattern with 1 handrail and min guard to ensure safety. VC's to instruct pt to "go up with the right leg and down with the left leg" in order to reduce LLE pain and for safety. Stair Management Technique: One rail Right    Exercises General Exercises - Lower Extremity Ankle Circles/Pumps: AROM;20 reps;Both;Seated Long Arc Quad: 20 reps;10 reps;Both;AROM;Seated (LLE: 2x5 with increased time needed, RLE: 20 reps) Heel Slides: AROM;20 reps;Right;Seated Hip Flexion/Marching: 20 reps;AROM;Right;Seated   PT Diagnosis:    PT Problem List:   PT Treatment Interventions:     PT Goals (current goals can now be found in the care plan section) Acute Rehab PT Goals Patient Stated Goal: did not state  Visit Information  Last PT Received On: 08/09/13 Assistance Needed: +1 History of Present Illness: Gregory Frazier is a 57 year old man with history of  pleomorphic sarcoma in left leg (s/p chemo with last treatment 2 weeks ago, scheduled for  left hemipelvectomy on 08/16/13 at [facility]), RUE DVT found on 9/18 now on Xarelto, who presents with productive cough x 3 weeks and lightheadedness x several days.    Subjective Data  Patient Stated Goal: did not state   Cognition  Cognition Arousal/Alertness: Awake/alert Behavior During Therapy: WFL for tasks assessed/performed Overall Cognitive Status: Within Functional Limits for tasks assessed    Balance     End of Session PT - End of Session Equipment Utilized During Treatment: Gait belt Activity Tolerance: Patient tolerated treatment well Patient left: in chair;with call bell/phone within reach;with family/visitor present   GP     Gregory Frazier 08/09/2013, 3:27 PM

## 2013-08-09 NOTE — Evaluation (Signed)
Occupational Therapy Evaluation Patient Details Name: Gregory Frazier MRN: [id number] DOB: 05-03-1956 Today's Date: 08/09/2013 Time: 7829-5621 OT Time Calculation (min): 24 min  OT Assessment / Plan / Recommendation History of present illness Gregory Frazier is a 57 year old man with history of pleomorphic sarcoma in left leg (s/p chemo with last treatment 2 weeks ago, scheduled for left hemipelvectomy on 08/16/13 at [facility]), RUE DVT found on 9/18 now on Xarelto, who presents with productive cough x 3 weeks and lightheadedness x several days.   Clinical Impression   Pt admitted with above. Provided education to pt and daughter during evaluation. Daughter states she is with pt 24/7 and feels comfortable assisting him as needed. Feel pt is safe to d/c home with family to assist 24/7.      OT Assessment  Patient does not need any further OT services    Follow Up Recommendations  No OT follow up;Supervision/Assistance - 24 hour    Barriers to Discharge      Equipment Recommendations  Toilet rise with handles; 3 in 1 bedside commode; Other (comment) (daughter states 3 in 1 will not fit over commode, but may be useful as a bedside commode)     Recommendations for Other Services    Frequency       Precautions / Restrictions Precautions Precautions: Other (comment) (Neutropenic) Precaution Comments: Neutropenic prec   Pertinent Vitals/Pain No pain reported.     ADL  Grooming: Set up Where Assessed - Grooming: Unsupported sitting Upper Body Bathing: Set up Where Assessed - Upper Body Bathing: Unsupported sitting Lower Body Bathing: Min guard Where Assessed - Lower Body Bathing: Supported sit to stand Upper Body Dressing: Set up Where Assessed - Upper Body Dressing: Unsupported sitting Lower Body Dressing: Min guard Where Assessed - Lower Body Dressing: Supported sit to stand Toilet Transfer: Simulated;Min Agricultural engineer Method: Sit to  Barista: Regular height toilet Toileting - Architect and Hygiene: Supervision/safety Where Assessed - Engineer, mining and Hygiene: Standing Tub/Shower Transfer: Simulated;Min guard Tub/Shower Transfer Method: Science writer: Other (comment) (practiced stepping over) Equipment Used: Gait belt;Rolling walker Transfers/Ambulation Related to ADLs: Min guard for ambulation. Supervision for transfers.  ADL Comments: Educated on safe shoes, use of bag on walker to carry items, and dressing technique. Practiced simulated tub transfer-cues for technique and Min guard level-pt has shower chair at home. Educated to elevate RUE to help decrease edema. Educated to stand in front of bed/chair with walker in front when pulling up LB clothing.  Educated daughter who was able to interpret during session. She states she is comfortable helping him upon d/c. Recommended her be with him 24/7.    OT Diagnosis:    OT Problem List:   OT Treatment Interventions:     OT Goals(Current goals can be found in the care plan section) Acute Rehab OT Goals Patient Stated Goal: did not state  Visit Information  Last OT Received On: 08/09/13 Assistance Needed: +1 History of Present Illness: Gregory Frazier is a 57 year old man with history of pleomorphic sarcoma in left leg (s/p chemo with last treatment 2 weeks ago, scheduled for left hemip[facility]ctomy on 08/16/13 at [facility]), RUE DVT found on 9/18 now on Xarelto, who presents with productive cough x 3 weeks and lightheadedness x several days.       Prior Functioning     Home Living Family/patient expects to be discharged to:: Private residence Living Arrangements: Alone Available Help at Discharge: Family;Available 24  hours/day (daughter) Type of Home: House Home Access: Stairs to enter [employer] of Steps: 5 Entrance Stairs-Rails: Right;Left;Can reach both Home Layout: One  level Home Equipment: Crutches;Walker - 2 wheels;Shower seat Prior Function Level of Independence: Independent with assistive device(s) Communication Communication: Prefers language other than English         Vision/Perception     Cognition  Cognition Arousal/Alertness: Awake/alert Behavior During Therapy: WFL for tasks assessed/performed Overall Cognitive Status: Within Functional Limits for tasks assessed    Extremity/Trunk Assessment Upper Extremity Assessment Upper Extremity Assessment: Overall WFL for tasks assessed;RUE deficits/detail RUE Deficits / Details: increased edema in Rt hand Lower Extremity Assessment Lower Extremity Assessment: Defer to PT evaluation     Mobility Bed Mobility Bed Mobility: Supine to Sit;Sit to Supine Supine to Sit: 5: Supervision Sit to Supine: 5: Supervision Transfers Transfers: Sit to Stand;Stand to Sit Sit to Stand: 5: Supervision;From bed;From toilet;4: Min guard Stand to Sit: 5: Supervision;To bed;To toilet;4: Min guard Details for Transfer Assistance: Cues for hand placement; Min guard/supervision level for simulated regular height toilet     Exercise     Balance     End of Session OT - End of Session Equipment Utilized During Treatment: Gait belt;Rolling walker Activity Tolerance: Patient tolerated treatment well Patient left: in bed;with family/visitor present  GO     Earlie Raveling OTR/L [phone] 08/09/2013, 2:48 PM

## 2013-08-09 NOTE — Progress Notes (Signed)
ANTICOAGULATION CONSULT NOTE - Follow Up Consult  Pharmacy Consult:  Lovenox Indication:  DVT  Allergies  Allergen Reactions  . Pork-Derived Products     Not an allergy - religious belief to not consume anything pork-derived **Patient and family state it is okay to have medications that alert [place] with the "pork-derived" warning**     Patient Measurements: Height: 5\' 4"  (162.6 cm) Weight: 141 lb 5 oz (64.1 kg) IBW/kg (Calculated) : 59.2  Vital Signs: Temp: 98.1 F (36.7 C) (09/29 0536) BP: 113/74 mmHg (09/29 0536) Pulse Rate: 82 (09/29 0536)  Labs:  Recent Labs  08/07/13 0425 08/08/13 0404 08/09/13 0553  HGB 7.5* 7.4* 8.1*  HCT 21.4* 21.5* 23.5*  PLT 131* 202 269  CREATININE 0.62 0.50 0.48*    Estimated Creatinine Clearance: 84.3 ml/min (by C-G formula based on Cr of 0.48).       Assessment: 89 YOM with recent DVT on Xarelto PTA.  Anticoagulation changed to full-dose Lovenox.  Patient's hemoglobin is improving and thrombocytopenia has resolved.  No bleeding reported and her CrCL remains appropriate for BID dosing of Lovenox.   Goal of Therapy:  Anti-Xa level 0.6-1.2 units/ml 4hrs after LMWH dose given Monitor platelets by anticoagulation protocol: Yes    Plan:  - Continue Lovenox 65mg  SQ Q12H - CBC Q72H while on Lovenox    Zorina Mallin D. Laney Potash, PharmD, BCPS Pager:  [phone] 08/09/2013, 10:47 AM

## 2013-08-12 LAB — CULTURE, BLOOD (ROUTINE X 2): Culture: NO GROWTH

## 2013-09-07 ENCOUNTER — Ambulatory Visit: Payer: Medicaid Other | Attending: Physical Medicine and Rehabilitation | Admitting: Physical Therapy

## 2013-09-07 DIAGNOSIS — IMO0001 Reserved for inherently not codable concepts without codable children: Secondary | ICD-10-CM | POA: Insufficient documentation

## 2013-09-07 DIAGNOSIS — M25559 Pain in unspecified hip: Secondary | ICD-10-CM | POA: Insufficient documentation

## 2013-09-07 DIAGNOSIS — R269 Unspecified abnormalities of gait and mobility: Secondary | ICD-10-CM | POA: Insufficient documentation

## 2013-09-20 ENCOUNTER — Ambulatory Visit: Payer: Medicaid Other | Attending: Physical Medicine and Rehabilitation | Admitting: Physical Therapy

## 2013-09-20 DIAGNOSIS — R269 Unspecified abnormalities of gait and mobility: Secondary | ICD-10-CM | POA: Insufficient documentation

## 2013-09-20 DIAGNOSIS — M25559 Pain in unspecified hip: Secondary | ICD-10-CM | POA: Insufficient documentation

## 2013-09-20 DIAGNOSIS — IMO0001 Reserved for inherently not codable concepts without codable children: Secondary | ICD-10-CM | POA: Insufficient documentation

## 2013-09-28 ENCOUNTER — Ambulatory Visit: Payer: Medicaid Other | Admitting: Physical Therapy

## 2014-01-17 ENCOUNTER — Ambulatory Visit: Payer: Medicaid Other | Attending: Physical Medicine and Rehabilitation | Admitting: Physical Therapy

## 2014-01-17 DIAGNOSIS — R269 Unspecified abnormalities of gait and mobility: Secondary | ICD-10-CM | POA: Insufficient documentation

## 2014-01-17 DIAGNOSIS — R5381 Other malaise: Secondary | ICD-10-CM | POA: Insufficient documentation

## 2014-01-17 DIAGNOSIS — Z4789 Encounter for other orthopedic aftercare: Secondary | ICD-10-CM | POA: Insufficient documentation

## 2014-01-17 DIAGNOSIS — C499 Malignant neoplasm of connective and soft tissue, unspecified: Secondary | ICD-10-CM | POA: Insufficient documentation

## 2014-01-31 ENCOUNTER — Ambulatory Visit: Payer: Medicaid Other | Admitting: Physical Therapy

## 2014-02-02 ENCOUNTER — Ambulatory Visit: Payer: Medicaid Other | Admitting: Physical Therapy

## 2014-02-09 ENCOUNTER — Ambulatory Visit: Payer: Medicaid Other | Attending: Physical Medicine and Rehabilitation | Admitting: Physical Therapy

## 2014-02-09 DIAGNOSIS — R269 Unspecified abnormalities of gait and mobility: Secondary | ICD-10-CM | POA: Insufficient documentation

## 2014-02-09 DIAGNOSIS — Z4789 Encounter for other orthopedic aftercare: Secondary | ICD-10-CM | POA: Insufficient documentation

## 2014-02-09 DIAGNOSIS — R5381 Other malaise: Secondary | ICD-10-CM | POA: Insufficient documentation

## 2014-02-09 DIAGNOSIS — C499 Malignant neoplasm of connective and soft tissue, unspecified: Secondary | ICD-10-CM | POA: Insufficient documentation

## 2014-02-16 ENCOUNTER — Ambulatory Visit: Payer: Medicaid Other | Admitting: Physical Therapy

## 2014-02-21 ENCOUNTER — Ambulatory Visit: Payer: Medicaid Other | Admitting: Physical Therapy

## 2014-02-23 ENCOUNTER — Ambulatory Visit: Payer: Medicaid Other | Admitting: Physical Therapy

## 2014-03-02 ENCOUNTER — Ambulatory Visit: Payer: Medicaid Other | Admitting: Physical Therapy

## 2014-03-09 ENCOUNTER — Ambulatory Visit: Payer: Medicaid Other | Admitting: Physical Therapy

## 2014-03-14 ENCOUNTER — Ambulatory Visit: Payer: Medicaid Other | Attending: Physical Medicine and Rehabilitation | Admitting: Physical Therapy

## 2014-03-14 DIAGNOSIS — C499 Malignant neoplasm of connective and soft tissue, unspecified: Secondary | ICD-10-CM | POA: Insufficient documentation

## 2014-03-14 DIAGNOSIS — R269 Unspecified abnormalities of gait and mobility: Secondary | ICD-10-CM | POA: Insufficient documentation

## 2014-03-14 DIAGNOSIS — Z4789 Encounter for other orthopedic aftercare: Secondary | ICD-10-CM | POA: Insufficient documentation

## 2014-03-14 DIAGNOSIS — R5381 Other malaise: Secondary | ICD-10-CM | POA: Insufficient documentation

## 2014-03-15 ENCOUNTER — Ambulatory Visit: Payer: Medicaid Other | Admitting: Physical Therapy

## 2014-03-17 ENCOUNTER — Ambulatory Visit: Payer: Medicaid Other | Admitting: Physical Therapy

## 2014-03-21 ENCOUNTER — Ambulatory Visit: Payer: Medicaid Other | Admitting: Physical Therapy

## 2014-03-23 ENCOUNTER — Encounter: Payer: Medicaid Other | Admitting: Physical Therapy

## 2014-03-30 ENCOUNTER — Encounter: Payer: Medicaid Other | Admitting: Physical Therapy

## 2014-04-06 ENCOUNTER — Encounter: Payer: Medicaid Other | Admitting: Physical Therapy

## 2014-06-03 ENCOUNTER — Emergency Department (HOSPITAL_COMMUNITY)
Admission: EM | Admit: 2014-06-03 | Discharge: 2014-06-04 | Disposition: A | Discharge status: Home / Self Care | Payer: Medicaid Other | Attending: Emergency Medicine | Admitting: Emergency Medicine

## 2014-06-03 ENCOUNTER — Encounter (HOSPITAL_COMMUNITY): Payer: Self-pay | Admitting: Emergency Medicine

## 2014-06-03 ENCOUNTER — Emergency Department (HOSPITAL_COMMUNITY): Payer: Medicaid Other

## 2014-06-03 DIAGNOSIS — S88919A Complete traumatic amputation of unspecified lower leg, level unspecified, initial encounter: Secondary | ICD-10-CM | POA: Insufficient documentation

## 2014-06-03 DIAGNOSIS — Z8589 Personal history of malignant neoplasm of other organs and systems: Secondary | ICD-10-CM | POA: Diagnosis not present

## 2014-06-03 DIAGNOSIS — R059 Cough, unspecified: Secondary | ICD-10-CM | POA: Insufficient documentation

## 2014-06-03 DIAGNOSIS — I2510 Atherosclerotic heart disease of native coronary artery without angina pectoris: Secondary | ICD-10-CM | POA: Diagnosis not present

## 2014-06-03 DIAGNOSIS — Z8719 Personal history of other diseases of the digestive system: Secondary | ICD-10-CM | POA: Diagnosis not present

## 2014-06-03 DIAGNOSIS — C7800 Secondary malignant neoplasm of unspecified lung: Secondary | ICD-10-CM | POA: Insufficient documentation

## 2014-06-03 DIAGNOSIS — R05 Cough: Secondary | ICD-10-CM

## 2014-06-03 DIAGNOSIS — R042 Hemoptysis: Secondary | ICD-10-CM | POA: Diagnosis present

## 2014-06-03 DIAGNOSIS — Z86718 Personal history of other venous thrombosis and embolism: Secondary | ICD-10-CM | POA: Insufficient documentation

## 2014-06-03 DIAGNOSIS — I10 Essential (primary) hypertension: Secondary | ICD-10-CM | POA: Insufficient documentation

## 2014-06-03 DIAGNOSIS — C801 Malignant (primary) neoplasm, unspecified: Secondary | ICD-10-CM

## 2014-06-03 LAB — CBC
HCT: 44.2 % (ref 39.0–52.0)
Hemoglobin: 15 g/dL (ref 13.0–17.0)
MCH: 29.5 pg (ref 26.0–34.0)
MCHC: 33.9 g/dL (ref 30.0–36.0)
MCV: 86.8 fL (ref 78.0–100.0)
PLATELETS: 134 10*3/uL — AB (ref 150–400)
RBC: 5.09 MIL/uL (ref 4.22–5.81)
RDW: 14.5 % (ref 11.5–15.5)
WBC: 5.1 10*3/uL (ref 4.0–10.5)

## 2014-06-03 LAB — BASIC METABOLIC PANEL
Anion gap: 12 (ref 5–15)
BUN: 11 mg/dL (ref 6–23)
CALCIUM: 8.3 mg/dL — AB (ref 8.4–10.5)
CO2: 24 meq/L (ref 19–32)
CREATININE: 0.69 mg/dL (ref 0.50–1.35)
Chloride: 107 mEq/L (ref 96–112)
GFR calc Af Amer: >90 mL/min (ref >90)
GFR calc non Af Amer: >90 mL/min (ref >90)
GLUCOSE: 106 mg/dL — AB (ref 70–99)
Potassium: 4.1 mEq/L (ref 3.7–5.3)
Sodium: 143 mEq/L (ref 137–147)

## 2014-06-03 LAB — I-STAT TROPONIN, ED: Troponin i, poc: 0 ng/mL (ref 0.00–0.08)

## 2014-06-03 MED ORDER — BENZONATATE 100 MG PO CAPS: 100.0000 mg | ORAL_CAPSULE | Freq: Three times a day (TID) | ORAL | Status: DC

## 2014-06-03 MED ORDER — IOHEXOL 350 MG/ML SOLN
80.0000 mL | Freq: Once | INTRAVENOUS | Status: AC | PRN
  Administered 2014-06-03: 80 mL via INTRAVENOUS

## 2014-06-03 NOTE — ED Notes (Signed)
Pt to ct angio

## 2014-06-03 NOTE — ED Notes (Signed)
Preparing patient for discharge. 

## 2014-06-03 NOTE — ED Notes (Signed)
Pt returned from ct

## 2014-06-03 NOTE — ED Provider Notes (Signed)
CSN: [id number]     Arrival date & time 06/03/14  1917 History   First MD Initiated Contact with Patient 06/03/14 1957     Chief Complaint  Patient presents with  . Chest Pain  . Hemoptysis     (Consider location/radiation/quality/duration/timing/severity/associated sxs/prior Treatment) HPI Comments: Patient presents to the emergency department with chief complaint of chest pain, cough, hemoptysis, and sore throat. Patient is accompanied by his family members. History is given through assistance of family members. Patient reportedly started coughing on Monday. Yesterday he started to have some hemoptysis. He recently returned from [place] after 2 months today. 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 He denies any associated fevers, or chills. He reports shortness of breath while coughing. He was taking Lovenox, but since discontinued this. Additionally, he has a history of DVT.  The history is provided by the patient. No language interpreter was used.    Past Medical History  Diagnosis Date  . Hypertension   . Cancer     Dr. Kendall Flack at [facility] is H/O; BONE CA-- CHEMO; pleomorphic sarcoma left leg/hip  . Coronary atherosclerosis     per CT 07/13/13 at [facility]  . Cholelithiases     per CT 07/13/13  . Fatty liver     per CT 07/13/13 at [facility]  . Pulmonary nodules     per CT 07/13/13 at [facility]; multiple b/l   . DVT (deep venous thrombosis)     dx 9/18 right IJ and R SCV on Xarelto   Past Surgical History  Procedure Laterality Date  . Other surgical history      renal stone  . Other surgical history      FNA  . Other surgical history      Bx of left pelvic mass iliac bone    Family History  Problem Relation Age of Onset  . Cancer Mother   . Diabetes Sister   . Other      grandaughter with immature teratoma   History  Substance Use Topics  . Smoking status: Never Smoker   . Smokeless tobacco: Never Used  . Alcohol Use: No    Review of Systems  All other  systems reviewed and are negative.     Allergies  Pork-derived products  Home Medications   Prior to Admission medications   Medication Sig Start Date End Date Taking? Authorizing Provider  acetaminophen (TYLENOL) 325 MG tablet Take 650 mg by mouth once.    Historical Provider, MD  chlorpheniramine-HYDROcodone (TUSSIONEX) 10-8 MG/5ML LQCR Take 5 mLs by mouth every 12 (twelve) hours. 08/09/13   Rebecca Eaton, MD  enoxaparin (LOVENOX) 80 MG/0.8ML injection Inject 0.65 mLs (65 mg total) into the skin every 12 (twelve) hours. 08/09/13   Rebecca Eaton, MD  LORazepam (ATIVAN) 1 MG tablet Take 1 mg by mouth every 6 (six) hours as needed (nausea).     Historical Provider, MD  prochlorperazine (COMPAZINE) 10 MG tablet Take 10 mg by mouth every 6 (six) hours as needed (nausea).    Historical Provider, MD   Pulse 90  Temp(Src) 98.8 F (37.1 C) (Oral)  Resp 16  Ht 5\' 6"  (1.676 m)  Wt 141 lb (63.957 kg)  BMI 22.77 kg/m2  SpO2 96% Physical Exam  Nursing note and vitals reviewed. Constitutional: He is oriented to person, place, and time. He appears well-developed and well-nourished.  HENT:  Head: Normocephalic and atraumatic.  Eyes: Conjunctivae and EOM are normal. Pupils are equal, round, and  reactive to light. Right eye exhibits no discharge. Left eye exhibits no discharge. No scleral icterus.  Neck: Normal range of motion. Neck supple. No JVD present.  Cardiovascular: Normal rate, regular rhythm and normal heart sounds.  Exam reveals no gallop and no friction rub.   No murmur heard. Pulmonary/Chest: Effort normal and breath sounds normal. No respiratory distress. He has no wheezes. He has no rales. He exhibits no tenderness.  Abdominal: Soft. He exhibits no distension and no mass. There is no tenderness. There is no rebound and no guarding.  Musculoskeletal: Normal range of motion. He exhibits no edema and no tenderness.  Left hemipelvectomy  Neurological: He is alert and oriented to  person, place, and time.  Skin: Skin is warm and dry.  Psychiatric: He has a normal mood and affect. His behavior is normal. Judgment and thought content normal.    ED Course  Procedures (including critical care time) Results for orders placed during the hospital encounter of 06/03/14  CBC      Result Value Ref Range   WBC 5.1  4.0 - 10.5 K/uL   RBC 5.09  4.22 - 5.81 MIL/uL   Hemoglobin 15.0  13.0 - 17.0 g/dL   HCT 44.2  39.0 - 52.0 %   MCV 86.8  78.0 - 100.0 fL   MCH 29.5  26.0 - 34.0 pg   MCHC 33.9  30.0 - 36.0 g/dL   RDW 14.5  11.5 - 15.5 %   Platelets 134 (*) 150 - 400 K/uL  BASIC METABOLIC PANEL      Result Value Ref Range   Sodium 143  137 - 147 mEq/L   Potassium 4.1  3.7 - 5.3 mEq/L   Chloride 107  96 - 112 mEq/L   CO2 24  19 - 32 mEq/L   Glucose, Bld 106 (*) 70 - 99 mg/dL   BUN 11  6 - 23 mg/dL   Creatinine, Ser 0.69  0.50 - 1.35 mg/dL   Calcium 8.3 (*) 8.4 - 10.5 mg/dL   GFR calc non Af Amer >90  >90 mL/min   GFR calc Af Amer >90  >90 mL/min   Anion gap 12  5 - 15  I-STAT TROPOININ, ED      Result Value Ref Range   Troponin i, poc 0.00  0.00 - 0.08 ng/mL   Comment 3            Ct Angio Chest Pe W/cm &/or Wo Cm  06/03/2014   CLINICAL DATA:  Hemoptysis. Cough and upper chest pain. Sore throat. History of sarcoma.  EXAM: CT ANGIOGRAPHY CHEST WITH CONTRAST  TECHNIQUE: Multidetector CT imaging of the chest was performed using the standard protocol during bolus administration of intravenous contrast. Multiplanar CT image reconstructions and MIPs were obtained to evaluate the vascular anatomy.  CONTRAST:  12mL OMNIPAQUE IOHEXOL 350 MG/ML SOLN  COMPARISON:  Chest radiograph performed earlier today at 7:53 p.m.  FINDINGS: There is no evidence of pulmonary embolus.  Bilateral pulmonary masses are seen, measuring 3.6 x 3.2 cm at the left upper lobe, and 3.1 x 1.8 cm at the right upper lobe. Additional scattered smaller pulmonary nodules are noted bilaterally, measuring up to 1.5  cm in size.  No significant pleural effusion or pneumothorax is seen.  Vague soft tissue density is noted at both hila, reflecting multiple mildly prominent nodes, without a dominant hilar mass. A 1.3 cm subcarinal node is seen. Additional mediastinal nodes remain normal in size. No pericardial effusion  is identified. No axillary lymphadenopathy is seen. The visualized portions of the thyroid gland are unremarkable in appearance.  A right-sided chest port is noted with its distal tip about the distal SVC.  There is diffuse fatty infiltration within the liver. The spleen is unremarkable in appearance. Scattered stones are noted layering dependently within the relatively decompressed gallbladder. The gallbladder is otherwise unremarkable. The visualized portions of the pancreas, adrenal glands and kidneys are within normal limits.  No acute osseous abnormalities are seen.  Review of the MIP images confirms the above findings.  IMPRESSION: 1. No evidence of pulmonary embolus. 2. Bilateral pulmonary masses noted, measuring 3.6 x 3.2 cm at the left upper lobe, and 3.1 x 1.8 cm at the right upper lobe. Additional scattered smaller pulmonary nodules seen bilaterally, measuring up to 1.5 cm in size. This most likely reflects metastatic disease from the patient's known sarcoma. 3. Mildly prominent bilateral hilar nodes seen. 1.3 cm subcarinal node noted. 4. Diffuse fatty infiltration within the liver. 5. Cholelithiasis noted. Gallbladder otherwise unremarkable in appearance.   Electronically Signed   By: Garald Balding M.D.   On: 06/03/2014 23:02   Dg Chest Port 1 View  06/03/2014   CLINICAL DATA:  Chest pain.  Hemoptysis.  EXAM: PORTABLE CHEST - 1 VIEW  COMPARISON:  Chest x-ray 08/08/2013.  FINDINGS: Right internal jugular double-lumen power porta cath with tip terminating in the superior aspect of the right atrium. Multiple new pulmonary nodules and masses, the largest of which project over the left upper hemithorax  measuring approximately 3.8 cm in diameter. No confluent consolidative airspace disease. No definite pleural effusion. Heart size is normal. Upper mediastinal contours slightly distorted by patient's rotation to the right.  IMPRESSION: 1. Multiple new pulmonary nodules and masses, concerning for metastatic disease. Correlation with contrast enhanced chest CT is recommended.   Electronically Signed   By: Vinnie Langton M.D.   On: 06/03/2014 20:07     Imaging Review Dg Chest Port 1 View  06/03/2014   CLINICAL DATA:  Chest pain.  Hemoptysis.  EXAM: PORTABLE CHEST - 1 VIEW  COMPARISON:  Chest x-ray 08/08/2013.  FINDINGS: Right internal jugular double-lumen power porta cath with tip terminating in the superior aspect of the right atrium. Multiple new pulmonary nodules and masses, the largest of which project over the left upper hemithorax measuring approximately 3.8 cm in diameter. No confluent consolidative airspace disease. No definite pleural effusion. Heart size is normal. Upper mediastinal contours slightly distorted by patient's rotation to the right.  IMPRESSION: 1. Multiple new pulmonary nodules and masses, concerning for metastatic disease. Correlation with contrast enhanced chest CT is recommended.   Electronically Signed   By: Vinnie Langton M.D.   On: 06/03/2014 20:07     EKG Interpretation   Date/Time:  Friday June 03 2014 19:29:19 EDT Ventricular Rate:  87 PR Interval:  128 QRS Duration: 94 QT Interval:  390 QTC Calculation: 469 R Axis:   77 Text Interpretation:  Normal sinus rhythm Normal ECG Confirmed by Ashok Cordia   MD, Lennette Bihari ([id number]) on 06/03/2014 8:13:12 PM      MDM   Final diagnoses:  Cough  Cancer    Patient with multiple new pulmonary nodules, chest x-ray. Chief complaint was shortness of breath and hemoptysis. CT chest with contrast was recommended for further evaluation. However, patient also recently returned from [place], has a history of DVTs, and is not low risk for  PE. I discussed patient with radiology, who recommended CT angio chest  in order to evaluate for PE as well as metastatic disease. Discussed this with the patient, who is in agreement with the plan.  11:22 PM Patient discussed with Dr. Ashok Cordia, who feels that the patient can be discharged.  Patient has close follow-up with [facility].  No gross hemoptysis in the ED.  No hypoxia or fever.  DC to home with instructions to call oncology.    Montine Circle, PA-C 06/03/14 [id number]

## 2014-06-03 NOTE — ED Provider Notes (Signed)
Medical screening examination/treatment/procedure(s) were conducted as a shared visit with non-physician practitioner(s) and myself.  I personally evaluated the patient during the encounter.   EKG Interpretation   Date/Time:  Friday June 03 2014 19:29:19 EDT Ventricular Rate:  87 PR Interval:  128 QRS Duration: 94 QT Interval:  390 QTC Calculation: 469 R Axis:   77 Text Interpretation:  Normal sinus rhythm Normal ECG Confirmed by Boaz Berisha   MD, Lennette Bihari ([id number]) on 06/03/2014 8:13:12 PM      Pt with hx sarcoma, lung mets, c/o hemoptysis, small amt blood tinged sputum. No sob. On review care everywhere, pt recently seen at [facility] w similar c/o, and has close f/u w heme onc arranged there. Pt breathing comfortably, no increased wob. No recurrent hemoptysis.   Mirna Mires, MD 06/03/14 [phone]

## 2014-06-03 NOTE — ED Notes (Signed)
Per daughter in triage pt presents with coughing up blood starting yesterday, but non productive cough, upper chest pain when coughing and sore throat since Monday. Pt recently returned from [place] on May 23, 2014, where pt was there for a 2 month stay. Daughter reports Sarcoma. Pt coughing in triage

## 2014-06-03 NOTE — Discharge Instructions (Signed)
Cough, Adult  A cough is a reflex that helps clear your throat and airways. It can help heal the body or may be a reaction to an irritated airway. A cough may only last 2 or 3 weeks (acute) or may last more than 8 weeks (chronic).  CAUSES Acute cough:  Viral or bacterial infections. Chronic cough:  Infections.  Allergies.  Asthma.  Post-nasal drip.  Smoking.  Heartburn or acid reflux.  Some medicines.  Chronic lung problems (COPD).  Cancer. SYMPTOMS   Cough.  Fever.  Chest pain.  Increased breathing rate.  High-pitched whistling sound when breathing (wheezing).  Colored mucus that you cough up (sputum). TREATMENT   A bacterial cough may be treated with antibiotic medicine.  A viral cough must run its course and will not respond to antibiotics.  Your caregiver may recommend other treatments if you have a chronic cough. HOME CARE INSTRUCTIONS   Only take over-the-counter or prescription medicines for pain, discomfort, or fever as directed by your caregiver. Use cough suppressants only as directed by your caregiver.  Use a cold steam vaporizer or humidifier in your bedroom or home to help loosen secretions.  Sleep in a semi-upright position if your cough is worse at night.  Rest as needed.  Stop smoking if you smoke. SEEK IMMEDIATE MEDICAL CARE IF:   You have pus in your sputum.  Your cough starts to worsen.  You cannot control your cough with suppressants and are losing sleep.  You begin coughing up blood.  You have difficulty breathing.  You develop pain which is getting worse or is uncontrolled with medicine.  You have a fever. MAKE SURE YOU:   Understand these instructions.  Will watch your condition.  Will get help right away if you are not doing well or get worse. Document Released: 04/26/2011 Document Revised: 01/20/2012 Document Reviewed: 04/26/2011 Exit[employer] Patient Information 2015 [employer]. This information is not intended  to replace advice given to you by your health care provider. Make sure you discuss any questions you have with your health care provider.  

## 2014-06-19 ENCOUNTER — Encounter (HOSPITAL_COMMUNITY): Payer: Self-pay | Admitting: Emergency Medicine

## 2014-06-19 ENCOUNTER — Emergency Department (HOSPITAL_COMMUNITY)
Admission: EM | Admit: 2014-06-19 | Discharge: 2014-06-20 | Disposition: A | Discharge status: Home / Self Care | Payer: Medicaid Other | Attending: Emergency Medicine | Admitting: Emergency Medicine

## 2014-06-19 ENCOUNTER — Emergency Department (HOSPITAL_COMMUNITY): Payer: Medicaid Other

## 2014-06-19 DIAGNOSIS — R51 Headache: Secondary | ICD-10-CM | POA: Diagnosis not present

## 2014-06-19 DIAGNOSIS — Z791 Long term (current) use of non-steroidal anti-inflammatories (NSAID): Secondary | ICD-10-CM | POA: Insufficient documentation

## 2014-06-19 DIAGNOSIS — IMO0001 Reserved for inherently not codable concepts without codable children: Secondary | ICD-10-CM | POA: Diagnosis not present

## 2014-06-19 DIAGNOSIS — Z8673 Personal history of transient ischemic attack (TIA), and cerebral infarction without residual deficits: Secondary | ICD-10-CM | POA: Diagnosis not present

## 2014-06-19 DIAGNOSIS — R52 Pain, unspecified: Secondary | ICD-10-CM | POA: Diagnosis present

## 2014-06-19 DIAGNOSIS — B9789 Other viral agents as the cause of diseases classified elsewhere: Secondary | ICD-10-CM | POA: Insufficient documentation

## 2014-06-19 DIAGNOSIS — Z8589 Personal history of malignant neoplasm of other organs and systems: Secondary | ICD-10-CM | POA: Insufficient documentation

## 2014-06-19 DIAGNOSIS — I10 Essential (primary) hypertension: Secondary | ICD-10-CM | POA: Diagnosis not present

## 2014-06-19 DIAGNOSIS — Z87442 Personal history of urinary calculi: Secondary | ICD-10-CM | POA: Insufficient documentation

## 2014-06-19 DIAGNOSIS — Z86718 Personal history of other venous thrombosis and embolism: Secondary | ICD-10-CM | POA: Diagnosis not present

## 2014-06-19 DIAGNOSIS — Z79899 Other long term (current) drug therapy: Secondary | ICD-10-CM | POA: Insufficient documentation

## 2014-06-19 DIAGNOSIS — E860 Dehydration: Secondary | ICD-10-CM | POA: Diagnosis not present

## 2014-06-19 DIAGNOSIS — R1013 Epigastric pain: Secondary | ICD-10-CM | POA: Diagnosis not present

## 2014-06-19 DIAGNOSIS — Z8719 Personal history of other diseases of the digestive system: Secondary | ICD-10-CM | POA: Insufficient documentation

## 2014-06-19 DIAGNOSIS — I2510 Atherosclerotic heart disease of native coronary artery without angina pectoris: Secondary | ICD-10-CM | POA: Diagnosis not present

## 2014-06-19 DIAGNOSIS — B349 Viral infection, unspecified: Secondary | ICD-10-CM

## 2014-06-19 DIAGNOSIS — M791 Myalgia, unspecified site: Secondary | ICD-10-CM

## 2014-06-19 LAB — COMPREHENSIVE METABOLIC PANEL
ALK PHOS: 128 U/L — AB (ref 39–117)
ALT: 58 U/L — ABNORMAL HIGH (ref 0–53)
ANION GAP: 16 — AB (ref 5–15)
AST: 44 U/L — ABNORMAL HIGH (ref 0–37)
Albumin: 3.4 g/dL — ABNORMAL LOW (ref 3.5–5.2)
BUN: 19 mg/dL (ref 6–23)
CALCIUM: 7.9 mg/dL — AB (ref 8.4–10.5)
CO2: 21 mEq/L (ref 19–32)
Chloride: 100 mEq/L (ref 96–112)
Creatinine, Ser: 0.67 mg/dL (ref 0.50–1.35)
GFR calc non Af Amer: >90 mL/min (ref >90)
GLUCOSE: 157 mg/dL — AB (ref 70–99)
Potassium: 4 mEq/L (ref 3.7–5.3)
Sodium: 137 mEq/L (ref 137–147)
Total Bilirubin: 0.6 mg/dL (ref 0.3–1.2)
Total Protein: 6.8 g/dL (ref 6.0–8.3)

## 2014-06-19 LAB — URINALYSIS, ROUTINE W REFLEX MICROSCOPIC
Bilirubin Urine: NEGATIVE
Glucose, UA: NEGATIVE mg/dL
HGB URINE DIPSTICK: NEGATIVE
KETONES UR: NEGATIVE mg/dL
Leukocytes, UA: NEGATIVE
Nitrite: NEGATIVE
PROTEIN: NEGATIVE mg/dL
Specific Gravity, Urine: 1.027 (ref 1.005–1.030)
Urobilinogen, UA: 0.2 mg/dL (ref 0.0–1.0)
pH: 5.5 (ref 5.0–8.0)

## 2014-06-19 LAB — CBC WITH DIFFERENTIAL/PLATELET
Basophils Absolute: 0 10*3/uL (ref 0.0–0.1)
Basophils Relative: 0 % (ref 0–1)
EOS ABS: 0.2 10*3/uL (ref 0.0–0.7)
EOS PCT: 4 % (ref 0–5)
HCT: 43.1 % (ref 39.0–52.0)
HEMOGLOBIN: 14.9 g/dL (ref 13.0–17.0)
LYMPHS ABS: 0.9 10*3/uL (ref 0.7–4.0)
Lymphocytes Relative: 22 % (ref 12–46)
MCH: 29.5 pg (ref 26.0–34.0)
MCHC: 34.6 g/dL (ref 30.0–36.0)
MCV: 85.3 fL (ref 78.0–100.0)
MONOS PCT: 1 % — AB (ref 3–12)
Monocytes Absolute: 0 10*3/uL — ABNORMAL LOW (ref 0.1–1.0)
Neutro Abs: 2.9 10*3/uL (ref 1.7–7.7)
Neutrophils Relative %: 73 % (ref 43–77)
Platelets: 82 10*3/uL — ABNORMAL LOW (ref 150–400)
RBC: 5.05 MIL/uL (ref 4.22–5.81)
RDW: 14.4 % (ref 11.5–15.5)
WBC: 4 10*3/uL (ref 4.0–10.5)

## 2014-06-19 LAB — LACTIC ACID, PLASMA: LACTIC ACID, VENOUS: 2 mmol/L (ref 0.5–2.2)

## 2014-06-19 LAB — LIPASE, BLOOD: Lipase: 35 U/L (ref 11–59)

## 2014-06-19 MED ORDER — ONDANSETRON HCL 4 MG/2ML IJ SOLN
4.0000 mg | Freq: Once | INTRAMUSCULAR | Status: AC
  Administered 2014-06-19: 4 mg via INTRAVENOUS
  Filled 2014-06-19: qty 2

## 2014-06-19 MED ORDER — IOHEXOL 300 MG/ML  SOLN
25.0000 mL | INTRAMUSCULAR | Status: AC
  Administered 2014-06-19: 25 mL via ORAL

## 2014-06-19 MED ORDER — GI COCKTAIL ~~LOC~~
30.0000 mL | Freq: Once | ORAL | Status: AC
  Administered 2014-06-19: 30 mL via ORAL
  Filled 2014-06-19: qty 30

## 2014-06-19 MED ORDER — KETOROLAC TROMETHAMINE 30 MG/ML IJ SOLN
30.0000 mg | Freq: Once | INTRAMUSCULAR | Status: AC
  Administered 2014-06-19: 30 mg via INTRAVENOUS
  Filled 2014-06-19: qty 1

## 2014-06-19 MED ORDER — IOHEXOL 300 MG/ML  SOLN
100.0000 mL | Freq: Once | INTRAMUSCULAR | Status: AC | PRN
  Administered 2014-06-19: 100 mL via INTRAVENOUS

## 2014-06-19 NOTE — ED Provider Notes (Signed)
CSN: [id number]     Arrival date & time 06/19/14  1921 History   First MD Initiated Contact with Patient 06/19/14 1932     Chief Complaint  Patient presents with  . Generalized Body Aches  . Headache  . Abdominal Pain     (Consider location/radiation/quality/duration/timing/severity/associated sxs/prior Treatment) HPI Gregory Frazier is a 58 y.o. male past medical history of metastatic pleomorphic sarcoma, presenting for body aches, headache, nausea, and abdominal pain. Patient is currently undergoing chemotherapy for recurrence of his cancer. He is status post left hemipelvectomy due to his prior sarcoma focus in his left leg. Patient has known metastatic disease. Patient endorses mild nausea, subjective fever, and chills, however Tmax measured prior to arrival is in the 99 range per family. Patient has otherwise been eating and drinking normal amounts. Denies cough. Denies dysuria, or other associated symptoms. Endorses generalized abdominal pain, but worse in his epigastrium.     Past Medical History  Diagnosis Date  . Hypertension   . Cancer     Dr. Kendall Flack at [facility] is H/O; BONE CA-- CHEMO; pleomorphic sarcoma left leg/hip  . Coronary atherosclerosis     per CT 07/13/13 at [facility]  . Cholelithiases     per CT 07/13/13  . Fatty liver     per CT 07/13/13 at [facility]  . Pulmonary nodules     per CT 07/13/13 at [facility]; multiple b/l   . DVT (deep venous thrombosis)     dx 9/18 right IJ and R SCV on Xarelto   Past Surgical History  Procedure Laterality Date  . Other surgical history      renal stone  . Other surgical history      FNA  . Other surgical history      Bx of left pelvic mass iliac bone   . Leg amputation Left    Family History  Problem Relation Age of Onset  . Cancer Mother   . Diabetes Sister   . Other      grandaughter with immature teratoma   History  Substance Use Topics  . Smoking status: Never Smoker   . Smokeless tobacco: Never Used  . Alcohol Use: No     Review of Systems  Constitutional: Negative.   Eyes: Negative.   Respiratory: Negative.   Cardiovascular: Negative.   Gastrointestinal: Positive for nausea and abdominal pain.  Endocrine: Negative.   Genitourinary: Negative.   Musculoskeletal: Positive for myalgias.  Skin: Negative.   Allergic/Immunologic: Negative.   Neurological: Positive for headaches.  Hematological: Negative.   Psychiatric/Behavioral: Negative.       Allergies  Pork-derived products  Home Medications   Prior to Admission medications   Medication Sig Start Date End Date Taking? Authorizing Provider  gabapentin (NEURONTIN) 300 MG capsule Take 600 mg by mouth 2 (two) times daily. 05/31/14 05/31/15 Yes Historical Provider, MD  acetaminophen (TYLENOL) 325 MG tablet Take 2 tablets (650 mg total) by mouth every 6 (six) hours as needed. 06/20/14   Hoyle Sauer, MD  ibuprofen (ADVIL,MOTRIN) 600 MG tablet Take 1 tablet (600 mg total) by mouth every 6 (six) hours as needed. 06/20/14   Hoyle Sauer, MD  ondansetron (ZOFRAN) 4 MG tablet Take 1 tablet (4 mg total) by mouth every 6 (six) hours. 06/20/14   Hoyle Sauer, MD  promethazine (PHENERGAN) 25 MG suppository Place 1 suppository (25 mg total) rectally every 6 (six) hours as needed for nausea or vomiting. 06/20/14   Hoyle Sauer, MD  promethazine (PHENERGAN) 25 MG tablet  Take 1 tablet (25 mg total) by mouth every 6 (six) hours as needed for nausea or vomiting. 06/20/14   Hoyle Sauer, MD   BP 121/73  Pulse 111  Temp(Src) 99.3 F (37.4 C) (Oral)  Resp 22  SpO2 99% Physical Exam  Nursing note and vitals reviewed. Constitutional: He is oriented to person, place, and time. He appears well-developed and well-nourished. No distress.  HENT:  Head: Normocephalic and atraumatic.  Right Ear: External ear normal.  Left Ear: External ear normal.  Nose: Nose normal.  Mouth/Throat: Oropharynx is clear and moist. No oropharyngeal exudate.  Eyes: Conjunctivae and  EOM are normal. Pupils are equal, round, and reactive to light. Right eye exhibits no discharge. Left eye exhibits no discharge. No scleral icterus.  Neck: Normal range of motion. Neck supple. No JVD present. No tracheal deviation present. No thyromegaly present.  Cardiovascular: Normal rate, regular rhythm, normal heart sounds and intact distal pulses.  Exam reveals no gallop and no friction rub.   No murmur heard. Pulmonary/Chest: Effort normal and breath sounds normal. No stridor. No respiratory distress. He has no wheezes. He has no rales. He exhibits no tenderness.  Abdominal: Soft. Bowel sounds are normal. He exhibits no distension. There is tenderness in the epigastric area. There is no rigidity, no rebound, no guarding, no tenderness at McBurney's point and negative Murphy's sign.  Musculoskeletal: Normal range of motion. He exhibits no edema and no tenderness.  S/P Left hemipelvectomy.  Lymphadenopathy:    He has no cervical adenopathy.  Neurological: He is alert and oriented to person, place, and time.  Skin: Skin is warm and dry. No rash noted. He is not diaphoretic. No erythema. No pallor.  Psychiatric: He has a normal mood and affect. His behavior is normal. Judgment and thought content normal.    ED Course  Procedures (including critical care time) Labs Review Labs Reviewed  CBC WITH DIFFERENTIAL - Abnormal; Notable for the following:    Platelets 82 (*)    Monocytes Relative 1 (*)    Monocytes Absolute 0.0 (*)    All other components within normal limits  COMPREHENSIVE METABOLIC PANEL - Abnormal; Notable for the following:    Glucose, Bld 157 (*)    Calcium 7.9 (*)    Albumin 3.4 (*)    AST 44 (*)    ALT 58 (*)    Alkaline Phosphatase 128 (*)    Anion gap 16 (*)    All other components within normal limits  URINALYSIS, ROUTINE W REFLEX MICROSCOPIC - Abnormal; Notable for the following:    Color, Urine AMBER (*)    All other components within normal limits  LIPASE,  BLOOD  LACTIC ACID, PLASMA    Imaging Review Dg Chest 2 View  06/19/2014   CLINICAL DATA:  Generalized body aches. History of sarcoma with metastatic disease.  EXAM: CHEST  2 VIEW  COMPARISON:  Most recent chest 06/03/2014.  FINDINGS: Normal cardiomediastinal silhouette. Port-A-Cath in good position tip in proximal RIGHT atrium. BILATERAL pulmonary metastases, with the dominant lesion in the LEFT upper lobe may be minimally increased compared with most recent priors, having a longest dimension is 38.5 mm as compared with 37.5 mm previously.  No infiltrates or failure. No effusion or pneumothorax. No acute osseous findings.  IMPRESSION: No active cardiopulmonary disease. Pulmonary metastatic disease may be slightly worse.   Electronically Signed   By: Rolla Flatten M.D.   On: 06/19/2014 21:43   Ct Abdomen Pelvis W Contrast  06/19/2014  CLINICAL DATA:  Generalized body aches, headache and low grade fever. Recent chemotherapy.  EXAM: CT ABDOMEN AND PELVIS WITH CONTRAST  TECHNIQUE: Multidetector CT imaging of the abdomen and pelvis was performed using the standard protocol following bolus administration of intravenous contrast.  CONTRAST:  166mL OMNIPAQUE IOHEXOL 300 MG/ML  SOLN  COMPARISON:  CT of the abdomen and pelvis performed 02/19/2011, and MRI of the pelvis performed 04/14/2013  FINDINGS: The visualized lung bases are clear.  The liver and spleen are unremarkable in appearance. A few tiny stones are seen dependently within the gallbladder. The gallbladder is otherwise unremarkable. The pancreas and adrenal glands are unremarkable.  A 1.2 cm cyst is noted at the lower pole of the left kidney. The kidneys are otherwise unremarkable in appearance. There is no evidence of hydronephrosis. No renal or ureteral stones are seen. No perinephric stranding is appreciated.  No free fluid is identified. The small bowel is unremarkable in appearance. The stomach is within normal limits. No acute vascular abnormalities  are seen.  The appendix is normal in caliber, without evidence for appendicitis. The colon is unremarkable.  The patient is status post resection of the left side of the pelvis and the left lower extremity. There is no definite evidence of recurrence of malignancy. Postoperative change is noted at the left-sided stump, without evidence of a focal mass. Left inguinal nodes remain normal in size. Minimal fluid at the surgical defect along the left side of the sacrum is thought to reflect a small postoperative seroma.  The bladder is mildly distended; increased attenuation within the bladder may reflect debris. Mildly increased calcification is noted with respect to the prostate; the prostate remains grossly normal in size. Decreased attenuation within the right external iliac vein and right common femoral vein is nonspecific and may simply reflect mixing of contrast. No inguinal lymphadenopathy is seen.  No acute osseous abnormalities are identified.  IMPRESSION: 1. No acute abnormality seen to explain the patient's symptoms. 2. No definite evidence of recurrence of malignancy at the surgical site, status post resection of the left side of the pelvis and left lower extremity. Left inguinal nodes remain normal in size. Minimal fluid at the surgical defect along the left side of the sacrum is thought to reflect a small postoperative seroma. Associated scarring seen. 3. Small left renal cyst noted. 4. Cholelithiasis; gallbladder otherwise unremarkable in appearance. 5. Increased attenuation within the bladder may reflect debris.   Electronically Signed   By: Garald Balding M.D.   On: 06/19/2014 23:43     EKG Interpretation   Date/Time:  Sunday June 19 2014 20:38:05 EDT Ventricular Rate:  99 PR Interval:  121 QRS Duration: 96 QT Interval:  362 QTC Calculation: 464 R Axis:   102 Text Interpretation:  Sinus rhythm Right axis deviation No significant  change since last tracing Confirmed by HARRISON  MD,  FORREST ([id number]) on  06/19/2014 9:09:47 PM      MDM   Final diagnoses:  Viral syndrome  Myalgia  Dehydration    Due to patient's prior history would be concerned for metastatic disease in his abdomen causing his current symptoms. Patient does not have any meningeal signs, and is alert and oriented otherwise. Symptoms appear consistent with a viral syndrome, however in the setting of current chemotherapy will check labs due to concern for neutropenia. Patient has not had a fever by rectal temperature. No indication for empiric antibiotic therapy at this time. We'll obtain a chest x-ray, urine studies, labs, and  CT abdomen and pelvis with infusion.  Laboratory workup and CT imaging unremarkable. Patient states improved symptoms with a GI cocktail, Toradol, IV fluids, and IV Zofran. Patient has not been febrile in the emergency department is not neutropenic. He has signs of dehydration his urinalysis. Patient likely has either viral syndrome or chemotherapy related side effects, leading to his dehydration and generalized body aches. Low concern for systemic bacterial infection at this time, or other condition requiring acute hospitalization. Have discussed symptomatic treatment with family, and benign workup, and they agree, under the circumstances patient should be managed at home. Patient does not have a primary care provider but does see his oncologist regularly for his infusions, have recommended followup with a general medical provider to assist for any additional medical issues. Patient provided up with followup contact information. Will also send him home with recommendations for over-the-counter pain/antipyretic medications, and prescription for Zofran, oral Phenergan, and rectal Phenergan, as needed for symptom control.  Patient and family given return precautions for viral syndromes, and chemotherapy related side-effects.  Advised to return for worsening symptoms including chest pain, shortness  of breath, severe headache, intractable nausea or vomiting, fever, or chills, inability to take medications, or other acute concerns.  Advised to follow up with wellness center in 2 days.  Patient and family in agreement with and expressed understanding of follow plan, plan of care, and return precautions.  All questions answered prior to discharge.  Patient was discharged in stable condition with family.  Patient care was discussed with my attending, Dr. Aline Brochure.  Hoyle Sauer, MD 06/20/14 [phone]

## 2014-06-19 NOTE — ED Notes (Addendum)
C/o generalized body aches, HA and "stomach ache", onset yesterday (denies: fever, nvd or bleeding). Had chemo on Tuesday. Low grade fever noted in triage. denies antipyretic PTA.

## 2014-06-20 MED ORDER — ONDANSETRON HCL 4 MG PO TABS: 4.0000 mg | ORAL_TABLET | Freq: Four times a day (QID) | ORAL | Status: DC

## 2014-06-20 MED ORDER — ACETAMINOPHEN 325 MG PO TABS: 650.0000 mg | ORAL_TABLET | Freq: Four times a day (QID) | ORAL | Status: DC | PRN

## 2014-06-20 MED ORDER — IBUPROFEN 600 MG PO TABS: 600.0000 mg | ORAL_TABLET | Freq: Four times a day (QID) | ORAL | Status: DC | PRN

## 2014-06-20 MED ORDER — PROMETHAZINE HCL 25 MG PO TABS: 25.0000 mg | ORAL_TABLET | Freq: Four times a day (QID) | ORAL | Status: DC | PRN

## 2014-06-20 MED ORDER — PROMETHAZINE HCL 25 MG RE SUPP: 25.0000 mg | Freq: Four times a day (QID) | RECTAL | Status: DC | PRN

## 2014-06-21 NOTE — ED Provider Notes (Signed)
Medical screening examination/treatment/procedure(s) were conducted as a shared visit with resident physician and myself.  I personally evaluated the patient during the encounter.    EKG Interpretation  Date/Time:  Sunday June 19 2014 20:38:05 EDT Ventricular Rate:  99 PR Interval:  121 QRS Duration: 96 QT Interval:  362 QTC Calculation: 464 R Axis:   102 Text Interpretation:  Sinus rhythm Right axis deviation No significant change since last tracing Confirmed by Tylee Newby  MD, Jarod Bozzo ([id number]) on 06/19/2014 9:09:47 PM      I interviewed and examined the patient. Lungs are CTAB. Cardiac exam wnl. Abdomen soft.  Pt likely w/ viral syndrome. I interpreted/reviewed the labs and/or imaging which were non-contributory.  Do not think this is meningitis. CT abd neg. Pt is not neutropenic or leukopenic. Would recommend return for any worsening sx.   Pamella Pert, MD 06/21/14 1052

## 2014-06-22 ENCOUNTER — Emergency Department (HOSPITAL_COMMUNITY)
Admission: EM | Admit: 2014-06-22 | Discharge: 2014-06-23 | Disposition: A | Discharge status: Home / Self Care | Payer: Medicaid Other | Attending: Emergency Medicine | Admitting: Emergency Medicine

## 2014-06-22 ENCOUNTER — Encounter (HOSPITAL_COMMUNITY): Payer: Self-pay | Admitting: Emergency Medicine

## 2014-06-22 DIAGNOSIS — I10 Essential (primary) hypertension: Secondary | ICD-10-CM | POA: Insufficient documentation

## 2014-06-22 DIAGNOSIS — Z791 Long term (current) use of non-steroidal anti-inflammatories (NSAID): Secondary | ICD-10-CM | POA: Diagnosis not present

## 2014-06-22 DIAGNOSIS — R21 Rash and other nonspecific skin eruption: Secondary | ICD-10-CM | POA: Insufficient documentation

## 2014-06-22 DIAGNOSIS — Z8719 Personal history of other diseases of the digestive system: Secondary | ICD-10-CM | POA: Diagnosis not present

## 2014-06-22 DIAGNOSIS — Z8589 Personal history of malignant neoplasm of other organs and systems: Secondary | ICD-10-CM | POA: Diagnosis not present

## 2014-06-22 DIAGNOSIS — I2510 Atherosclerotic heart disease of native coronary artery without angina pectoris: Secondary | ICD-10-CM | POA: Diagnosis not present

## 2014-06-22 DIAGNOSIS — Z87442 Personal history of urinary calculi: Secondary | ICD-10-CM | POA: Insufficient documentation

## 2014-06-22 DIAGNOSIS — R197 Diarrhea, unspecified: Secondary | ICD-10-CM | POA: Insufficient documentation

## 2014-06-22 DIAGNOSIS — Z79899 Other long term (current) drug therapy: Secondary | ICD-10-CM | POA: Diagnosis not present

## 2014-06-22 DIAGNOSIS — L282 Other prurigo: Secondary | ICD-10-CM

## 2014-06-22 DIAGNOSIS — Z86718 Personal history of other venous thrombosis and embolism: Secondary | ICD-10-CM | POA: Diagnosis not present

## 2014-06-22 DIAGNOSIS — IMO0002 Reserved for concepts with insufficient information to code with codable children: Secondary | ICD-10-CM | POA: Diagnosis not present

## 2014-06-22 NOTE — ED Notes (Signed)
Per Patient: Pt report itching and redness around both of his eyes starting yesterday. Family suspects this may be a reaction to one of his Chemo drugs. Pt alertx4, NAD. Airway intact. Pt does report dry mouth.

## 2014-06-22 NOTE — ED Notes (Signed)
Pt denies any visual change or complaints.

## 2014-06-23 MED ORDER — LOPERAMIDE HCL 2 MG PO CAPS
4.0000 mg | ORAL_CAPSULE | Freq: Once | ORAL | Status: AC
  Administered 2014-06-23: 4 mg via ORAL
  Filled 2014-06-23: qty 2

## 2014-06-23 MED ORDER — LORATADINE 10 MG PO TABS: 10.0000 mg | ORAL_TABLET | Freq: Every day | ORAL | Status: DC

## 2014-06-23 MED ORDER — PREDNISONE 50 MG PO TABS: 50.0000 mg | ORAL_TABLET | Freq: Every day | ORAL | Status: DC

## 2014-06-23 MED ORDER — PREDNISONE 20 MG PO TABS
60.0000 mg | ORAL_TABLET | Freq: Once | ORAL | Status: AC
  Administered 2014-06-23: 60 mg via ORAL
  Filled 2014-06-23: qty 3

## 2014-06-23 MED ORDER — DIPHENHYDRAMINE HCL 25 MG PO CAPS
25.0000 mg | ORAL_CAPSULE | Freq: Once | ORAL | Status: AC
  Administered 2014-06-23: 25 mg via ORAL
  Filled 2014-06-23: qty 1

## 2014-06-23 NOTE — ED Provider Notes (Signed)
CSN: [id number]     Arrival date & time 06/22/14  1940 History   First MD Initiated Contact with Patient 06/22/14 2331     Chief Complaint  Patient presents with  . Allergic Reaction     (Consider location/radiation/quality/duration/timing/severity/associated sxs/prior Treatment) Patient is a 58 y.o. male presenting with allergic reaction. The history is provided by the patient.  Allergic Reaction He developed a rash around his eyes yesterday. Red spots were noted and it is itchy. She does and extends down into his upper trunk. He is getting chemotherapy and last chemotherapy was 8 days ago. He then underwent chemotherapy medications he is getting. He denies typically breathing or swallowing. There is no fever or chills. He has not taken anything to help his symptoms. There has been no new exposures such as soap, laundry detergent, fabric softener, shampoo, cologne, etc. Also, he developed mild diarrhea today. He has had 2 loose bowel movements.  Past Medical History  Diagnosis Date  . Hypertension   . Cancer     Dr. Kendall Flack at [facility] is H/O; BONE CA-- CHEMO; pleomorphic sarcoma left leg/hip  . Coronary atherosclerosis     per CT 07/13/13 at [facility]  . Cholelithiases     per CT 07/13/13  . Fatty liver     per CT 07/13/13 at [facility]  . Pulmonary nodules     per CT 07/13/13 at [facility]; multiple b/l   . DVT (deep venous thrombosis)     dx 9/18 right IJ and R SCV on Xarelto   Past Surgical History  Procedure Laterality Date  . Other surgical history      renal stone  . Other surgical history      FNA  . Other surgical history      Bx of left pelvic mass iliac bone   . Leg amputation Left    Family History  Problem Relation Age of Onset  . Cancer Mother   . Diabetes Sister   . Other      grandaughter with immature teratoma   History  Substance Use Topics  . Smoking status: Never Smoker   . Smokeless tobacco: Never Used  . Alcohol Use: No    Review of Systems  All other systems reviewed  and are negative.     Allergies  Pork-derived products  Home Medications   Prior to Admission medications   Medication Sig Start Date End Date Taking? Authorizing Provider  acetaminophen (TYLENOL) 500 MG tablet Take 500 mg by mouth every 6 (six) hours as needed for mild pain.   Yes Historical Provider, MD  dexamethasone (DECADRON) 4 MG tablet Take 4 mg by mouth 2 (two) times daily with a meal.   Yes Historical Provider, MD  gabapentin (NEURONTIN) 600 MG tablet Take 600 mg by mouth 3 (three) times daily.   Yes Historical Provider, MD  ibuprofen (ADVIL,MOTRIN) 600 MG tablet Take 1 tablet (600 mg total) by mouth every 6 (six) hours as needed. 06/20/14  Yes Hoyle Sauer, MD  LORazepam (ATIVAN) 1 MG tablet Take 1 mg by mouth every 6 (six) hours as needed for anxiety.   Yes Historical Provider, MD  mirtazapine (REMERON) 15 MG tablet Take 15 mg by mouth at bedtime.   Yes Historical Provider, MD  ondansetron (ZOFRAN) 4 MG tablet Take 4 mg by mouth every 8 (eight) hours as needed for nausea or vomiting.   Yes Historical Provider, MD  polyethylene glycol (MIRALAX / GLYCOLAX) packet Take 17 g by mouth daily.  Yes Historical Provider, MD  prochlorperazine (COMPAZINE) 10 MG tablet Take 5 mg by mouth every 6 (six) hours as needed for nausea or vomiting.   Yes Historical Provider, MD  traMADol (ULTRAM) 50 MG tablet Take 50 mg by mouth every 6 (six) hours as needed for moderate pain.   Yes Historical Provider, MD  vitamin C (ASCORBIC ACID) 500 MG tablet Take 500 mg by mouth daily.   Yes Historical Provider, MD  loratadine (CLARITIN) 10 MG tablet Take 1 tablet (10 mg total) by mouth daily. 3/82/50   Delora Fuel, MD  predniSONE (DELTASONE) 50 MG tablet Take 1 tablet (50 mg total) by mouth daily. 2 tabs po daily x 3 days 5/39/76   Delora Fuel, MD   BP 734/19  Pulse 85  Temp(Src) 98.4 F (36.9 C) (Oral)  Resp 23  Ht 5\' 3"  (1.6 m)  Wt 150 lb (68.04 kg)  BMI 26.58 kg/m2  SpO2 97% Physical Exam   Nursing note and vitals reviewed.  58 year old male, resting comfortably and in no acute distress. Vital signs are significant for tachypnea. Oxygen saturation is 97%, which is normal. Head is normocephalic and atraumatic. PERRLA, EOMI. Oropharynx is clear. Neck is nontender and supple without adenopathy or JVD. Back is nontender and there is no CVA tenderness. Lungs are clear without rales, wheezes, or rhonchi. Chest is nontender. Heart has regular rate and rhythm without murmur. Abdomen is soft, flat, nontender without masses or hepatosplenomegaly and peristalsis is normoactive. Extremities have no cyanosis or edema, full range of motion is present. Skin is warm and dry. Erythematous papular rash is present in the periorbital area and faintly on the upper chest. Neurologic: Mental status is normal, cranial nerves are intact, there are no motor or sensory deficits.  ED Course  Procedures (including critical care time)  MDM   Final diagnoses:  Pruritic rash  Diarrhea    Pruritic rash of uncertain cause. This could be from a viral exanthem and could be contact dermatitis. I doubt it is related to his chemotherapy because it was a week from when he received the chemotherapy when the rash developed. He also seen in the ED 3 days ago and prescribed several different antiemetics but he has not taken any of them. Diarrhea is of uncertain cause and will be treated symptomatically.    Delora Fuel, MD 37/90/24 [id number]

## 2014-06-23 NOTE — Discharge Instructions (Signed)
Take loperamide (Imodium AD) as needed for diarrhea. Take diphenhydramina (Benadryl) as needed for itching.  Erupcin cutnea (Rash)  Una erupcin es un cambio en el color o en la textura de la piel. Hay diferentes tipos de erupcin. Puede ser que tenga otros sntomas que acompaan la erupcin.  CAUSAS   Infecciones.  Reacciones alrgicas. Esto incluye alergias a mascotas o a medicamentos.  Ciertos medicamentos.  Exposicin a ciertas sustancias qumicas, jabones o cosmticos.  El calor.  Exposicin a plantas venenosas.  Tumores, tanto cancerosos como no cancerosos. SNTOMAS   Enrojecimiento.  Piel escamosa.  Picazn en la piel.  [place].  Bultos.  Ampollas.  Dolor. DIAGNSTICO  El mdico har un examen fsico para determinar qu tipo de erupcin tiene. Podrn tomarle una muestra de piel (biopsia) para ser examinada en el microscopio.  TRATAMIENTO  El tratamiento depende del tipo de erupcin que usted tenga. El mdico puede prescribirle algunos medicamentos. En los casos graves, Designer, industrial/product ver a un mdico Statistician (dermatlogo).  INSTRUCCIONES PARA EL CUIDADO DOMICILIARIO  Evite las sustancias que han causado la erupcin.  No se rasque la lesin. Puede ocasionarle una infeccin.  Tome baos con agua fresca para Metallurgist.  Tome slo medicamentos de venta libre o recetados, segn las indicaciones del mdico.  Cumpla con todas las visitas de control, segn le indique su mdico. SOLICITE ATENCIN MDICA DE INMEDIATO SI:  El dolor, la hinchazn o el enrojecimiento [place].  Tiene fiebre.  Tiene sntomas nuevos o graves.  Siente dolor en el cuerpo, diarrea o vmitos.  La erupcin no mejora en el trmino de 3 das. ASEGRESE DE QUE:   Comprende estas instrucciones.  Controlar su enfermedad.  Solicitar ayuda de inmediato si no mejora o si empeora. Document Released: 08/07/2005 Document Revised: 07/22/2012 [facility]  Patient Information 2015 [place]. This information is not intended to replace advice given to you by your health care provider. Make sure you discuss any questions you have with your health care provider.  Diarrea  (Diarrhea) La diarrea consiste en evacuaciones intestinales frecuentes, blandas o acuosas. Puede hacerlo sentir dbil y deshidratado. La deshidratacin puede hacer que se sienta cansado, sediento, tener la boca seca y que haya disminucin de [place], que a menudo es de color amarillo oscuro. La diarrea es un signo de otro problema, generalmente una infeccin que no durar [employer]. En la [employer], la diarrea dura tpicamente 2 a 3 das. Sin embargo, puede durar ms tiempo si se trata de un signo de algo ms serio. Es importante tratar la diarrea como lo indique su mdico para disminuir o prevenir futuros episodios de Clinical biochemist.  CAUSAS  Algunas causas comunes son:   Infecciones gastrointestinales causadas por virus, bacterias o parsitos.  Intoxicacin alimentaria o alergias a los alimentos.  Ciertos medicamentos, como los antibiticos, quimioterapia y laxantes.  Edulcorantes artificiales y fructosa.  Los trastornos [employer]. INSTRUCCIONES PARA EL CUIDADO EN EL HOGAR   Asegure una adecuada ingesta de lquidos (hidratacin). Evite los lquidos que contengan azcares simples o las bebidas deportivas, los jugos de frutas, los productos derivados de la leche entera y [place]. Si bebe la cantidad suficiente de lquidos, la orina debe ser clara o amarillo plido. Una solucin de rehidratacin oral se puede comprar en las farmacias, en las tiendas minoristas y por Internet. Se puede preparar una solucin de rehidratacin oral casera con los siguientes ingredientes:   - cucharadita de sal.   cucharadita de bicarbonato.  de cucharadita de sal sustituta que contenga cloruro de potasio.  1  cucharada de azcar.  1l (34 onzas) de agua.  Ciertos alimentos y  bebidas pueden aumentar la velocidad a la que el alimento se mueve a travs del tracto gastrointestinal (GI). Estos alimentos y bebidas deben evitarse e incluyen:  Bebidas alcohlicas y con cafena.  Alimentos ricos en fibra, como frutas y verduras, nueces, semillas, panes y cereales integrales.  Alimentos y bebidas endulzados con alcoholes de azcar, tales como xilitol, sorbitol, y manitol.  Algunos alimentos pueden ser bien tolerados y puede ayudar a [employer], incluyendo:  Alimentos con almidn, como arroz, pan, pasta, cereales bajos en azcar, avena, smola de maz, papas al horno, galletas y panecillos.  Bananas.  Pur de [employer].  Agregue alimentos ricos en probiticos a la dieta del nio para ayudar a aumentar las bacterias saludables en el tracto gastrointestinal, como el yogur y productos lcteos fermentados.  Lvese bien las manos despus de cada episodio de diarrea.  Tome slo medicamentos de venta libre o recetados, segn las indicaciones del [place] un bao caliente para ayudar a disminuir ardor o dolor por los episodios frecuentes de diarrea. SOLICITE ATENCIN MDICA DE INMEDIATO SI:   No puede retener los lquidos.  Tiene vmitos persistentes.  Lollie Marrow en la materia fecal, o las heces son negras y de aspecto alquitranado.  No hay emisin de [place] durante 6 a 8 horas o elimina una pequea cantidad de [place].  Tiene dolor abdominal que aumenta o se localiza.  Est muy mareado o se desvanece.  Sufre un dolor intenso de [place].  La diarrea empeora o no mejora.  Tiene fiebre o sntomas que persisten durante ms de 2 o 3 das.  Tiene fiebre y los sntomas empeoran de manera sbita. ASEGRESE DE QUE:   Comprende estas instrucciones.  Controlar su enfermedad.  Solicitar ayuda de inmediato si no mejora o si empeora. Document Released: 10/28/2005 Document Revised: 10/14/2012 [facility] Patient Information 2015 [place].  This information is not intended to replace advice given to you by your health care provider. Make sure you discuss any questions you have with your health care provider.  Loratadine tablets Qu es este medicamento? La LORATADINA es un antihistamnico. Ayuda a [employer] estornudos, goteo de la nariz y ojos llorosos e irritados. Este medicamento se [place] para tratar los sntomas de las [place]. Adems puede tratar erupciones cutneas y urticarias. Este medicamento puede ser utilizado para otros usos; si tiene alguna pregunta consulte con su proveedor de atencin mdica o con su farmacutico. MARCAS COMERCIALES DISPONIBLES: Alavert, Allergy Relief, Claritin, Claritin Hives Relief, Clear-Atadine, QlearQuil All Day & All Night Allergy Relief, Tavist ND Qu le debo informar a mi profesional de la salud antes de tomar este medicamento? Necesita saber si usted presenta alguno de los siguientes problemas o situaciones: -asma -enfermedad renal -enfermedad heptica -una reaccin alrgica o inusual a la loratadina, a otros antihistamnicos, a otros medicamentos, alimentos, colorantes o conservantes -si est embarazada o buscando quedar embarazada -si est amamantando a un beb Cmo debo utilizar este medicamento? Tome este medicamento por va oral con un vaso de agua. Siga las instrucciones de la etiqueta del [place]. Puede tomar este medicamento con alimentos o con el estmago [place] sus dosis a intervalos regulares. No tome su medicamento con una frecuencia mayor a la indicada. Hable con su pediatra para informarse acerca del uso de este medicamento en nios. Aunque este medicamento se puede [employer] en  nios tan menores como de 6 aos de edad para condiciones selectivas, las precauciones se aplican. Sobredosis: Pngase en contacto inmediatamente con un centro toxicolgico o una sala de urgencia si usted cree que haya tomado demasiado medicamento. ATENCIN: [employer] es solo para  usted. No comparta este medicamento con nadie. Qu sucede si me olvido de una dosis? Si olvida una dosis, tmela lo antes posible. Si es casi la hora de la prxima dosis, tome slo esa dosis. No tome dosis adicionales o dobles. Qu puede interactuar con este medicamento? -otros medicamentos para resfros o Health visitor ser que esta lista no menciona todas las posibles interacciones. Informe a su profesional de [employer]	[place] de [employer] productos a base de hierbas, medicamentos de [place] o suplementos nutritivos que est tomando. Si usted fuma, consume bebidas alcohlicas o si utiliza drogas ilegales, indqueselo tambin a su profesional de [employer]	[place]. Algunas sustancias pueden interactuar con su medicamento. A qu debo estar atento al usar [employer]? Si los sntomas no mejoran o si empeoran, consulte con su mdico o con su profesional de [employer]	[place]. Se le podr secar la boca. Masticar chicle sin azcar, chupar caramelos duros y tomar agua en abundancia le ayudar a mantener la boca hmeda. Si el problema no desaparece o es severo, consulte a su mdico. Puede experimentar mareos o somnolencia. No conduzca ni utilice maquinaria ni haga nada que Associate Professor en estado de alerta hasta que sepa cmo le afecta este medicamento. No se siente ni se ponga de pie con rapidez, especialmente si es un paciente de edad avanzada. Esto reduce el riesgo de mareos o [employer]. Qu efectos secundarios puedo tener al [employer] este medicamento? Efectos secundarios que debe informar a su mdico o a Barrister's clerk de la salud tan pronto como sea posible: -Chief of Staff como erupcin cutnea, picazn o urticarias, hinchazn de la cara, labios o lengua -problemas respiratorios -inquietud o nerviosismo inusual Efectos secundarios que, por lo general, no requieren atencin mdica (debe informarlos a su mdico o a su profesional de la salud si persisten o si son molestos): -somnolencia -boca o garganta  seca o irritada -dolor de cabeza Puede ser que esta lista no menciona todos los posibles efectos secundarios. Comunquese a su mdico por asesoramiento mdico [employer]. Usted puede informar los efectos secundarios a la FDA por telfono al 1-800-FDA-1088. Dnde debo guardar mi medicina? Mantngala fuera del alcance de los nios. Gurdela a temperatura ambiente de [place] 2 y 69 grados C (57 y 74 grados F). Protjala de la humedad. Deseche todo el medicamento que no haya utilizado, despus de la fecha de vencimiento. ATENCIN: Este folleto es un resumen. Puede ser que no cubra toda la posible informacin. Si usted tiene preguntas acerca de esta medicina, consulte con su mdico, su farmacutico o su profesional de Technical sales engineer.  2015, Elsevier/Gold Standard. (2006-06-27 15:44:00)  Prednisone tablets Qu es este medicamento? La PREDNISONA es un corticosteroide. Se utiliza para tratar la inflamacin de la piel, articulaciones, pulmones y otros rganos. Condiciones comunes tratadas incluyen asma, alergias y artritis. Tambin se [place] para [employer], tales como trastornos sanguneos o enfermedades de la glndula suprarrenal. Este medicamento puede ser utilizado para otros usos; si tiene alguna pregunta consulte con su proveedor de atencin mdica o con su farmacutico. MARCAS COMERCIALES DISPONIBLES: Deltasone, Predone, Sterapred, Sterapred DS Qu le debo informar a mi profesional de la salud antes de tomar este medicamento? Necesita saber si usted presenta alguno de los siguientes problemas o  situaciones: -Sndrome de [place] -diabetes -glaucoma -enfermedad cardiaca -alta presin sangunea -infeccin (especialmente infecciones virales, como varicela o herpes) -enfermedad renal -enfermedad heptica -problemas mentales -miastenia gravis -osteoporosis -convulsiones -problemas estomacales o intestinales -enfermedad tiroidea -una reaccin alrgica o inusual a la  lactosa, a la prednisona, a otros medicamentos, alimentos, colorantes o conservantes -si est embarazada o buscando quedar embarazada -si est amamantando a un beb Cmo debo utilizar este medicamento? Tome este medicamento por va oral con un vaso de agua. Siga las instrucciones de la etiqueta del [place]. Tome este medicamento con alimentos. Si slo toma este medicamento una vez al da, tmelo por la [place]. No tome ms medicamento que lo indicado. No deje de tomar este medicamento de manera abrupta debido a que podr Engineer, materials reaccin severa. Su mdico le indicar la cantidad de Event organiser. Si su mdico desea que [employer], la dosis ser reducida gradualmente para Research officer, political party secundarios. Hable con su pediatra para informarse acerca del uso de este medicamento en nios. Puede requerir atencin especial. Sobredosis: Pngase en contacto inmediatamente con un centro toxicolgico o una sala de urgencia si usted cree que haya tomado demasiado medicamento. ATENCIN: [employer] es solo para usted. No comparta este medicamento con nadie. Qu sucede si me olvido de una dosis? Si olvida una dosis, tmela tan pronto como sea posible. Si es casi la hora de su dosis siguiente, consulte a su mdico o a su profesional de Technical sales engineer. Usted puede necesitar saltar una dosis o tomar una dosis adicional. No tome dosis dobles o adicionales sin asesoramiento. Qu puede interactuar con este medicamento? No tome esta medicina con ninguno de los siguientes medicamentos: -metirapona -mifepristona Esta medicina tambin puede interactuar con los siguientes medicamentos: -aminoglutetimida -anfotericina B -aspirina o medicamentos tipo aspirina -barbitricos -ciertos medicamentos para la diabetes, tales como glipizida o gliburida -colestiramina -inhibidores de colinesterasa -ciclosporina -digoxina -diurticos -efedrina -hormonas femeninas, como estrgenos,  progestinas o pldoras anticonceptivas -isoniazida -quetoconazol -los [place], medicamentos para el dolor o inflamacin, como ibuprofeno o naproxeno -fenitona -rifampicina -toxoide -vacunas -warfarina Puede ser que esta lista no menciona todas las posibles interacciones. Informe a su profesional de [employer]	[place] de [employer] productos a base de hierbas, medicamentos de [place] o suplementos nutritivos que est tomando. Si usted fuma, consume bebidas alcohlicas o si utiliza drogas ilegales, indqueselo tambin a su profesional de [employer]	[place]. Algunas sustancias pueden interactuar con su medicamento. A qu debo estar atento al usar Coca-Cola? Visite a su mdico o a su profesional de la salud para chequear su evolucin peridicamente. Si toma este medicamento durante un perodo prolongado, lleve consigo una tarjeta de identificacin con su nombre y direccin, el tipo y la dosis del [place], y Academic librarian y la direccin de su mdico. [place] medicamento puede aumentar su riesgo de contraer una infeccin. Informe a su mdico o a su profesional de la salud si est en contacto con personas con sarampin o varicela, o si desarrolla llagas o ampollas que no se curan bien. Si va a someterse a una operacin, informe a su mdico o a su profesional de la salud que ha tomado este [employer] ltimos doce meses. Consulte a su mdico o a su profesional de la salud acerca de su dieta. Tal vez tendr que reducir la cantidad de sal que consume. Este medicamento puede afectar su nivel de Dispensing optician. Si tiene diabetes, consulte a su mdico o a su profesional de la salud antes de [place]ar[place] manager su  dieta o la dosis de su medicamento para la diabetes. Qu efectos secundarios puedo tener al [employer] este medicamento? Efectos secundarios que debe informar a su mdico o a Barrister's clerk de la salud tan pronto como sea posible: -Chief of Staff como erupcin cutnea, picazn o urticarias, hinchazn de la  cara, labios o lengua -cambios de emociones o humor -cambios en la visin -humor deprimido -dolor ocular -fiebre o escalofros, tos, dolor de garganta, dolor o dificultad para orinar -aumento de sed -hinchazn de tobillos, pies Efectos secundarios que, por lo general, no requieren atencin mdica (debe informarlos a su mdico o a su profesional de la salud si persisten o si son molestos): -confusin, excitacin, inquietud -dolor de cabeza -nuseas, vmito -problemas en la piel, acn, piel delgada y brillante -dificultad para conciliar el sueo -aumento de peso Puede ser que esta lista no menciona todos los posibles efectos secundarios. Comunquese a su mdico por asesoramiento mdico [employer]. Usted puede informar los efectos secundarios a la FDA por telfono al 1-800-FDA-1088. Dnde debo guardar mi medicina? Mantngala fuera del alcance de los nios. Gurdel[place] a [employer], entre 15 y 67 grados C (61 y 42 grados F). Protejla de la luz. Mantenga el envase bien cerrado. Deseche los medicamentos que no haya utilizado, despus de la fecha de vencimiento. ATENCIN: Este folleto es un resumen. Puede ser que no cubra toda la posible informacin. Si usted tiene preguntas acerca de esta medicina, consulte con su mdico, su farmacutico o su profesional de Technical sales engineer.  2015, Elsevier/Gold Standard. (2011-07-17 16:57:34)

## 2014-11-18 ENCOUNTER — Encounter (HOSPITAL_COMMUNITY): Payer: Self-pay | Admitting: Emergency Medicine

## 2014-11-18 ENCOUNTER — Emergency Department (HOSPITAL_COMMUNITY): Payer: Medicaid Other

## 2014-11-18 ENCOUNTER — Emergency Department (HOSPITAL_COMMUNITY)
Admission: EM | Admit: 2014-11-18 | Discharge: 2014-11-18 | Disposition: A | Discharge status: Home / Self Care | Payer: Medicaid Other | Attending: Emergency Medicine | Admitting: Emergency Medicine

## 2014-11-18 DIAGNOSIS — Z89512 Acquired absence of left leg below knee: Secondary | ICD-10-CM | POA: Diagnosis not present

## 2014-11-18 DIAGNOSIS — M25561 Pain in right knee: Secondary | ICD-10-CM | POA: Insufficient documentation

## 2014-11-18 DIAGNOSIS — Z86718 Personal history of other venous thrombosis and embolism: Secondary | ICD-10-CM | POA: Insufficient documentation

## 2014-11-18 DIAGNOSIS — Z79899 Other long term (current) drug therapy: Secondary | ICD-10-CM | POA: Insufficient documentation

## 2014-11-18 DIAGNOSIS — Z8719 Personal history of other diseases of the digestive system: Secondary | ICD-10-CM | POA: Diagnosis not present

## 2014-11-18 DIAGNOSIS — I2510 Atherosclerotic heart disease of native coronary artery without angina pectoris: Secondary | ICD-10-CM | POA: Insufficient documentation

## 2014-11-18 DIAGNOSIS — Z85830 Personal history of malignant neoplasm of bone: Secondary | ICD-10-CM | POA: Diagnosis not present

## 2014-11-18 DIAGNOSIS — M545 Low back pain, unspecified: Secondary | ICD-10-CM

## 2014-11-18 DIAGNOSIS — Z7952 Long term (current) use of systemic steroids: Secondary | ICD-10-CM | POA: Diagnosis not present

## 2014-11-18 DIAGNOSIS — R52 Pain, unspecified: Secondary | ICD-10-CM

## 2014-11-18 DIAGNOSIS — I10 Essential (primary) hypertension: Secondary | ICD-10-CM | POA: Insufficient documentation

## 2014-11-18 LAB — CBC WITH DIFFERENTIAL/PLATELET
BASOS ABS: 0 10*3/uL (ref 0.0–0.1)
BASOS PCT: 1 % (ref 0–1)
EOS PCT: 1 % (ref 0–5)
Eosinophils Absolute: 0.1 10*3/uL (ref 0.0–0.7)
HCT: 33.4 % — ABNORMAL LOW (ref 39.0–52.0)
Hemoglobin: 10.7 g/dL — ABNORMAL LOW (ref 13.0–17.0)
Lymphocytes Relative: 21 % (ref 12–46)
Lymphs Abs: 1.3 10*3/uL (ref 0.7–4.0)
MCH: 27.2 pg (ref 26.0–34.0)
MCHC: 32 g/dL (ref 30.0–36.0)
MCV: 84.8 fL (ref 78.0–100.0)
Monocytes Absolute: 0.1 10*3/uL (ref 0.1–1.0)
Monocytes Relative: 2 % — ABNORMAL LOW (ref 3–12)
NEUTROS ABS: 4.5 10*3/uL (ref 1.7–7.7)
Neutrophils Relative %: 75 % (ref 43–77)
Platelets: 387 10*3/uL (ref 150–400)
RBC: 3.94 MIL/uL — ABNORMAL LOW (ref 4.22–5.81)
RDW: 18.6 % — ABNORMAL HIGH (ref 11.5–15.5)
WBC: 6 10*3/uL (ref 4.0–10.5)

## 2014-11-18 LAB — BASIC METABOLIC PANEL
ANION GAP: 7 (ref 5–15)
BUN: 9 mg/dL (ref 6–23)
CO2: 26 mmol/L (ref 19–32)
Calcium: 8.6 mg/dL (ref 8.4–10.5)
Chloride: 104 mEq/L (ref 96–112)
Creatinine, Ser: 0.62 mg/dL (ref 0.50–1.35)
GFR calc Af Amer: >90 mL/min (ref >90)
GFR calc non Af Amer: >90 mL/min (ref >90)
GLUCOSE: 128 mg/dL — AB (ref 70–99)
Potassium: 3.5 mmol/L (ref 3.5–5.1)
SODIUM: 137 mmol/L (ref 135–145)

## 2014-11-18 LAB — URINE MICROSCOPIC-ADD ON

## 2014-11-18 LAB — URINALYSIS, ROUTINE W REFLEX MICROSCOPIC
BILIRUBIN URINE: NEGATIVE
Glucose, UA: NEGATIVE mg/dL
Hgb urine dipstick: NEGATIVE
Ketones, ur: NEGATIVE mg/dL
Nitrite: NEGATIVE
PH: 6 (ref 5.0–8.0)
Protein, ur: NEGATIVE mg/dL
SPECIFIC GRAVITY, URINE: 1.02 (ref 1.005–1.030)
UROBILINOGEN UA: 0.2 mg/dL (ref 0.0–1.0)

## 2014-11-18 MED ORDER — OXYCODONE-ACETAMINOPHEN 10-325 MG PO TABS: 1.0000 | ORAL_TABLET | ORAL | Status: DC | PRN

## 2014-11-18 NOTE — ED Notes (Signed)
Acuity change.   Patient currently undergoing chemo per NP.   Patient had recent amputation of left leg.

## 2014-11-18 NOTE — ED Provider Notes (Signed)
CSN: [id number]     Arrival date & time 11/18/14  2706 History  This chart was scribed for non-physician practitioner, Glendell Docker, NP-C working with Charlesetta Shanks, MD by Einar Pheasant, ED scribe. This patient was seen in room TR08C/TR08C and the patient's care was started at 10:10 AM.     Chief Complaint  Patient presents with  . Back Pain  . Knee Pain   The history is provided by the patient and a relative. No language interpreter was used.   HPI Comments: Gregory Frazier is a 59 y.o. 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 He is also complaining of right knee pain as well which he does not think is associated with his back pain. Son states that he was prescribed oxycodone for his back pain, however, the medication doesn't seem to be helping with his back and knee pain. He denies any radiating back pain. Son states that his father is still receiving chemotherapy treatments for his ongoing CA diagnosis. His last dose of chemo was last week Tuesday. Pt is being treated in [place]. Denies any dysuria,   Past Medical History  Diagnosis Date  . Hypertension   . Cancer     Dr. Kendall Flack at [facility] is H/O; BONE CA-- CHEMO; pleomorphic sarcoma left leg/hip  . Coronary atherosclerosis     per CT 07/13/13 at [facility]  . Cholelithiases     per CT 07/13/13  . Fatty liver     per CT 07/13/13 at [facility]  . Pulmonary nodules     per CT 07/13/13 at [facility]; multiple b/l   . DVT (deep venous thrombosis)     dx 9/18 right IJ and R SCV on Xarelto   Past Surgical History  Procedure Laterality Date  . Other surgical history      renal stone  . Other surgical history      FNA  . Other surgical history      Bx of left pelvic mass iliac bone   . Leg amputation Left    Family History  Problem Relation Age of Onset  . Cancer Mother   . Diabetes Sister   .  Other      grandaughter with immature teratoma   History  Substance Use Topics  . Smoking status: Never Smoker   . Smokeless tobacco: Never Used  . Alcohol Use: No    Review of Systems  Constitutional: Negative for chills.  Respiratory: Negative for shortness of breath.   Gastrointestinal: Negative for nausea and vomiting.  Musculoskeletal: Positive for arthralgias.  All other systems reviewed and are negative.     Allergies  Pork-derived products  Home Medications   Prior to Admission medications   Medication Sig Start Date End Date Taking? Authorizing Provider  acetaminophen (TYLENOL) 500 MG tablet Take 500 mg by mouth every 6 (six) hours as needed for mild pain.    Historical Provider, MD  dexamethasone (DECADRON) 4 MG tablet Take 4 mg by mouth 2 (two) times daily with a meal.    Historical Provider, MD  gabapentin (NEURONTIN) 600 MG tablet Take 600 mg by mouth 3 (three) times daily.    Historical Provider, MD  ibuprofen (ADVIL,MOTRIN) 600 MG tablet Take 1 tablet (600 mg total) by mouth every 6 (six) hours as needed. 06/20/14   Hoyle Sauer, MD  loratadine (CLARITIN) 10 MG tablet Take  1 tablet (10 mg total) by mouth daily. 1/61/09   Delora Fuel, MD  LORazepam (ATIVAN) 1 MG tablet Take 1 mg by mouth every 6 (six) hours as needed for anxiety.    Historical Provider, MD  mirtazapine (REMERON) 15 MG tablet Take 15 mg by mouth at bedtime.    Historical Provider, MD  ondansetron (ZOFRAN) 4 MG tablet Take 4 mg by mouth every 8 (eight) hours as needed for nausea or vomiting.    Historical Provider, MD  polyethylene glycol (MIRALAX / GLYCOLAX) packet Take 17 g by mouth daily.    Historical Provider, MD  predniSONE (DELTASONE) 50 MG tablet Take 1 tablet (50 mg total) by mouth daily. 2 tabs po daily x 3 days 04/14/53   Delora Fuel, MD  prochlorperazine (COMPAZINE) 10 MG tablet Take 5 mg by mouth every 6 (six) hours as needed for nausea or vomiting.    Historical Provider, MD  traMADol  (ULTRAM) 50 MG tablet Take 50 mg by mouth every 6 (six) hours as needed for moderate pain.    Historical Provider, MD  vitamin C (ASCORBIC ACID) 500 MG tablet Take 500 mg by mouth daily.    Historical Provider, MD   BP 117/72 mmHg  Pulse 100  Temp(Src) 98.2 F (36.8 C) (Oral)  Resp 18  SpO2 96%  Physical Exam  Constitutional: He is oriented to person, place, and time. He appears well-developed and well-nourished. No distress.  HENT:  Head: Normocephalic and atraumatic.  Eyes: Conjunctivae and EOM are normal.  Neck: Neck supple. No tracheal deviation present.  Cardiovascular: Normal rate.   Pulmonary/Chest: Effort normal. No respiratory distress.  Musculoskeletal: Normal range of motion.  Left lower extremity amputation. No redness swelling or warmth noted to the right extremity. Pt has full rom. Lumbar midline tenderness.  Neurological: He is alert and oriented to person, place, and time.  Skin: Skin is warm and dry.  Psychiatric: He has a normal mood and affect. His behavior is normal.  Nursing note and vitals reviewed.   ED Course  Procedures (including critical care time)  DIAGNOSTIC STUDIES: Oxygen Saturation is 96% on RA, adequate by my interpretation.    COORDINATION OF CARE: 10:17 AM- Will order X-ray of back and right knee. Pt advised of plan for treatment and pt agrees.  Labs Review Labs Reviewed  URINALYSIS, ROUTINE W REFLEX MICROSCOPIC - Abnormal; Notable for the following:    Color, Urine AMBER (*)    Leukocytes, UA TRACE (*)    All other components within normal limits  CBC WITH DIFFERENTIAL - Abnormal; Notable for the following:    RBC 3.94 (*)    Hemoglobin 10.7 (*)    HCT 33.4 (*)    RDW 18.6 (*)    Monocytes Relative 2 (*)    All other components within normal limits  BASIC METABOLIC PANEL - Abnormal; Notable for the following:    Glucose, Bld 128 (*)    All other components within normal limits  URINE MICROSCOPIC-ADD ON - Abnormal; Notable for the  following:    Crystals CA OXALATE CRYSTALS (*)    All other components within normal limits  URINE CULTURE    Imaging Review Dg Lumbar Spine Complete  11/18/2014   CLINICAL DATA:  R lower back pain and R knee pain. Patient denies any urinary symptoms. Denies N/V. Patient denies injury to both. Pt has hx of bone cancer.  EXAM: LUMBAR SPINE - COMPLETE 4+ VIEW  COMPARISON:  CT 06/19/2014  FINDINGS: Previous  resection of left ilium and a portion of the left sacrum. There is a mild lumbar levoscoliosis apex L4. No fracture or dislocation. Vertebral body and disc height maintained throughout. Regional soft tissues unremarkable. No worrisome lytic or sclerotic lesion.  IMPRESSION: 1. Negative for fracture, dislocation, or other focal bone abnormality. 2. Stable postop changes   Electronically Signed   By: Arne Cleveland M.D.   On: 11/18/2014 11:22   Dg Knee Complete 4 Views Right  11/18/2014   CLINICAL DATA:  R lower back pain and R knee pain. Patient denies any urinary symptoms. Denies N/V. Patient denies injury to both. Pt has hx of bone cancer  EXAM: RIGHT KNEE - COMPLETE 4+ VIEW  COMPARISON:  None.  FINDINGS: There is no evidence of fracture, dislocation, or joint effusion. There is no evidence of arthropathy or other focal bone abnormality. Soft tissues are unremarkable.  IMPRESSION: Negative.   Electronically Signed   By: Arne Cleveland M.D.   On: 11/18/2014 11:05     MDM   Final diagnoses:  Pain  Right knee pain  Right-sided low back pain without sciatica    No lytic lesions or masses noted on x-ray. Will increase oxycodone dose, discussed with pt the need to talk to his oncologist about this  I personally performed the services described in this documentation, which was scribed in my presence. The recorded information has been reviewed and is accurate.     Glendell Docker, NP 11/18/14 [place], MD 11/20/14 [phone]

## 2014-11-18 NOTE — Discharge Instructions (Signed)
You need to talk to your oncologist that you are having continued pain. Artralgia (Arthralgia) El profesional que le asiste ha diagnosticado que usted padece de artralgia. Artralgia significa simplemente dolor en una articulacin. Las causas pueden ser muchas; entre ellas se incluyen:  Una lesin en la articulacin que provoca inflamacin (molestias).  Desgaste de las articulaciones que se produce con el avance de los aos (osteoartritis).  Uso excesivo de Water engineer.  Diversas formas de artritis.  Infecciones en la articulacin. Cualquiera que sea la causa del dolor, generalmente hay buena respuesta a los antiinflamatorios y al reposo. La excepcin ocurre cuando la articulacin est infectada y estos casos, si la causa es una infeccin bacteriana (por una bacteria), se tratan con antibiticos (medicamentos que [employer] grmenes). INSTRUCCIONES PARA EL CUIDADO DOMICILIARIO  Mantenga en reposo la zona lesionada durante el tiempo que se lo indique su mdico, o el tiempo que le indique el profesional que lo asiste. Luego comience a Risk manager esa articulacin lentamente del modo en que se lo aconseje el profesional y a Sports administrator se lo permita. El uso de [employer] puede ser beneficioso en el caso de lesiones en el tobillo, rodilla o cadera. Si la rodilla est entablillada o enyesada, muvala y cudela como se le indic. Si hoy le han colocado un venda elstica o que se estira), debe retirarlo y colocarlo nuevamente cada 3  4 horas. No debe aplicarlo de manera muy apretada, pero s con la firmeza necesaria para evitar la hinchazn. Vigile los dedos de las manos y los pies y observe si se hinchan, se tornan azules, se enfran o entumecen o siente dolor intenso. Si se presenta alguno de estos sntomas (problemas), retire el vendaje y aplquelo nuevamente de un modo ms flojo. Si estos sntomas persisten, contacte al profesional que lo asiste o acuda nuevamente a Dietitian.  Durante las  primeras 24 horas, mientras est [address], mantenga elevada la extremidad [employer] 2 almohadas.  Mientras se encuentra despierto Forensic psychologist aplique hielo durante 15 a 20 minutos, cada dos horas, para Best boy; luego 3 a 4 veces por Engineer, petroleum las primeras 48 horas. Ponga el hielo en una bolsa plstica y coloque una toalla entre la bolsa y la piel.  Use la tablilla, el yeso, el vendaje elstico o el cabestrillo segn se le ha indicado.  Utilice los medicamentos de venta libre o de prescripcin para Conservation officer, historic buildings, Health and safety inspector o la [place], segn se lo indique el profesional que lo asiste. No tomeaspirina inmediatamente despus de la lesin a menos que se lo haya indicado su mdico. La aspirina puede ocasionarle un aumento en el sangrado y moretones en los tejidos.  Si se le aconsej la utilizacin de [place], contine usndolas segn las instrucciones y no vuelva a soportar peso sobre la articulacin lesionada hasta que se le indique que puede [place]. Un dolor persistente y la incapacidad para Risk manager el rea afectada durante 2  3 das son seales de Hydrographic surveyor que le indican que debe consultar a un profesional para Optometrist un seguimiento cuanto antes. Al comienzo, una fractura (ruptura del hueso) lineal puede no ser visible en las radiografas. Un dolor e hinchazn persistentes indican que necesita una evaluacin ms profunda, que no debe utilizar la articulacin lesionada o soportar peso (use las muletas si se le ha indicado) y/o que debe realizarse radiografas complementarias. En muchos casos las radiografas no revelan las fracturas pequeas hasta una semana o diez das ms tarde.  Concierte una cita para Chartered certified accountant seguimiento con el profesional que lo asiste o con aqul al que lo hayan remitido. Es posible que un radilogo (especialista en la lectura de radiografas) revise nuevamente sus placas. Asegrese de Neurosurgeon cmo retirar los resultados de sus radiografas. No  piense que el resultado es normal por el hecho de que no lo hayamos llamado. SOLICITE ATENCIN MDICA SI: Aumentan los moretones, la hinchazn o Conservation officer, historic buildings. SOLICITE ATENCIN MDICA DE INMEDIATO SI:  Sus dedos estn adormecidos o presentan una Lawyer.  El dolor no responde a los medicamentos y sigue igual o Ambulance person.  El dolor en la articulacin se intensifica.  La temperatura eleva por encima de 38,9 C (102 F).  Le resulta cada vez ms difcil moverla. EST SEGURO QUE:   Comprende las instrucciones para el alta mdica.  Controlar su enfermedad.  Solicitar atencin mdica de inmediato segn las indicaciones. Document Released: 10/28/2005 Document Revised: 01/20/2012 [facility] Patient Information 2015 [place]. This information is not intended to replace advice given to you by your health care provider. Make sure you discuss any questions you have with your health care provider.

## 2014-11-18 NOTE — ED Notes (Signed)
Patient states just started having R lower back pain and R knee pain.   Patient denies any urinary symptoms.   Denies N/V.   Patient denies injury to both.

## 2014-11-19 LAB — URINE CULTURE
COLONY COUNT: NO GROWTH
CULTURE: NO GROWTH

## 2015-01-25 ENCOUNTER — Emergency Department (HOSPITAL_COMMUNITY)
Admission: EM | Admit: 2015-01-25 | Discharge: 2015-01-25 | Disposition: A | Discharge status: Home / Self Care | Payer: Medicaid Other | Attending: Emergency Medicine | Admitting: Emergency Medicine

## 2015-01-25 ENCOUNTER — Encounter (HOSPITAL_COMMUNITY): Payer: Self-pay | Admitting: *Deleted

## 2015-01-25 DIAGNOSIS — M7918 Myalgia, other site: Secondary | ICD-10-CM

## 2015-01-25 DIAGNOSIS — Z859 Personal history of malignant neoplasm, unspecified: Secondary | ICD-10-CM | POA: Insufficient documentation

## 2015-01-25 DIAGNOSIS — M542 Cervicalgia: Secondary | ICD-10-CM | POA: Insufficient documentation

## 2015-01-25 DIAGNOSIS — Z7952 Long term (current) use of systemic steroids: Secondary | ICD-10-CM | POA: Insufficient documentation

## 2015-01-25 DIAGNOSIS — I10 Essential (primary) hypertension: Secondary | ICD-10-CM | POA: Insufficient documentation

## 2015-01-25 DIAGNOSIS — Z8719 Personal history of other diseases of the digestive system: Secondary | ICD-10-CM | POA: Diagnosis not present

## 2015-01-25 DIAGNOSIS — Z86718 Personal history of other venous thrombosis and embolism: Secondary | ICD-10-CM | POA: Insufficient documentation

## 2015-01-25 DIAGNOSIS — G8929 Other chronic pain: Secondary | ICD-10-CM | POA: Diagnosis not present

## 2015-01-25 DIAGNOSIS — Z79899 Other long term (current) drug therapy: Secondary | ICD-10-CM | POA: Diagnosis not present

## 2015-01-25 LAB — URINALYSIS, ROUTINE W REFLEX MICROSCOPIC
Bilirubin Urine: NEGATIVE
Glucose, UA: NEGATIVE mg/dL
Hgb urine dipstick: NEGATIVE
KETONES UR: NEGATIVE mg/dL
Leukocytes, UA: NEGATIVE
Nitrite: NEGATIVE
PH: 6.5 (ref 5.0–8.0)
Protein, ur: NEGATIVE mg/dL
SPECIFIC GRAVITY, URINE: 1.021 (ref 1.005–1.030)
Urobilinogen, UA: 1 mg/dL (ref 0.0–1.0)

## 2015-01-25 LAB — COMPREHENSIVE METABOLIC PANEL
ALBUMIN: 2.9 g/dL — AB (ref 3.5–5.2)
ALT: 19 U/L (ref 0–53)
AST: 30 U/L (ref 0–37)
Alkaline Phosphatase: 93 U/L (ref 39–117)
Anion gap: 11 (ref 5–15)
BILIRUBIN TOTAL: 0.4 mg/dL (ref 0.3–1.2)
BUN: 7 mg/dL (ref 6–23)
CALCIUM: 9 mg/dL (ref 8.4–10.5)
CHLORIDE: 100 mmol/L (ref 96–112)
CO2: 28 mmol/L (ref 19–32)
Creatinine, Ser: 0.53 mg/dL (ref 0.50–1.35)
GFR calc Af Amer: >90 mL/min (ref >90)
Glucose, Bld: 142 mg/dL — ABNORMAL HIGH (ref 70–99)
Potassium: 3.8 mmol/L (ref 3.5–5.1)
SODIUM: 139 mmol/L (ref 135–145)
Total Protein: 6.3 g/dL (ref 6.0–8.3)

## 2015-01-25 LAB — CBC WITH DIFFERENTIAL/PLATELET
Basophils Absolute: 0 10*3/uL (ref 0.0–0.1)
Basophils Relative: 0 % (ref 0–1)
EOS ABS: 0.3 10*3/uL (ref 0.0–0.7)
Eosinophils Relative: 4 % (ref 0–5)
HCT: 35.9 % — ABNORMAL LOW (ref 39.0–52.0)
Hemoglobin: 11.4 g/dL — ABNORMAL LOW (ref 13.0–17.0)
Lymphocytes Relative: 20 % (ref 12–46)
Lymphs Abs: 1.5 10*3/uL (ref 0.7–4.0)
MCH: 26.8 pg (ref 26.0–34.0)
MCHC: 31.8 g/dL (ref 30.0–36.0)
MCV: 84.5 fL (ref 78.0–100.0)
Monocytes Absolute: 0.6 10*3/uL (ref 0.1–1.0)
Monocytes Relative: 8 % (ref 3–12)
NEUTROS PCT: 68 % (ref 43–77)
Neutro Abs: 5.2 10*3/uL (ref 1.7–7.7)
PLATELETS: 228 10*3/uL (ref 150–400)
RBC: 4.25 MIL/uL (ref 4.22–5.81)
RDW: 19.4 % — ABNORMAL HIGH (ref 11.5–15.5)
WBC: 7.6 10*3/uL (ref 4.0–10.5)

## 2015-01-25 MED ORDER — OXYCODONE-ACETAMINOPHEN 10-325 MG PO TABS: 1.0000 | ORAL_TABLET | ORAL | Status: DC | PRN

## 2015-01-25 MED ORDER — OXYCODONE-ACETAMINOPHEN 5-325 MG PO TABS
2.0000 | ORAL_TABLET | Freq: Once | ORAL | Status: AC
  Administered 2015-01-25: 2 via ORAL
  Filled 2015-01-25: qty 2

## 2015-01-25 NOTE — Discharge Instructions (Signed)
Return to the ED with any concerns including fever/chills, chest pain, difficulty breathing, fainting, decreased level of alertness/lethargy, or any other alarming symptoms

## 2015-01-25 NOTE — ED Notes (Signed)
Used interpretor phones: pt reports pain to entire body x several days. Denies n/v/d.

## 2015-01-25 NOTE — ED Notes (Signed)
Pt placed in a gown and hooked up to monitor with BP cuff and pulse ox

## 2015-01-25 NOTE — ED Provider Notes (Signed)
CSN: [id number]     Arrival date & time 01/25/15  1400 History   First MD Initiated Contact with Patient 01/25/15 1701     Chief Complaint  Patient presents with  . Pain     (Consider location/radiation/quality/duration/timing/severity/associated sxs/prior Treatment) HPI  Hx obtained via translator phone.    Pt states he has pain in his full body that has been ongoing for the past 1-2 months.  He initially stated pain is in entire body, then stated primarily in both sides of his neck and radiating down to his shoulders bilaterally. No falls or trauma.  No fever/chills.  He states he talked to his oncologist about the pain but states the pain medication they gave is not helping.  He is not sure what he has been taking.  Has bottle of gabapentin here with him, but on chart has been taking oxycodone/oxycontin etc.  Unclear but it seems he has run out of the other pain meds.  No chest pain. No abdominal pain.  No change in po intake.  States the pain is worse with movement of head from side to side.  There are no other associated systemic symptoms, there are no other alleviating or modifying factors.   Past Medical History  Diagnosis Date  . Hypertension   . Cancer     Dr. Kendall Flack at [facility] is H/O; BONE CA-- CHEMO; pleomorphic sarcoma left leg/hip  . Coronary atherosclerosis     per CT 07/13/13 at [facility]  . Cholelithiases     per CT 07/13/13  . Fatty liver     per CT 07/13/13 at [facility]  . Pulmonary nodules     per CT 07/13/13 at [facility]; multiple b/l   . DVT (deep venous thrombosis)     dx 9/18 right IJ and R SCV on Xarelto   Past Surgical History  Procedure Laterality Date  . Other surgical history      renal stone  . Other surgical history      FNA  . Other surgical history      Bx of left pelvic mass iliac bone   . Leg amputation Left    Family History  Problem Relation Age of Onset  . Cancer Mother   . Diabetes Sister   . Other      grandaughter with immature teratoma   History   Substance Use Topics  . Smoking status: Never Smoker   . Smokeless tobacco: Never Used  . Alcohol Use: No    Review of Systems  ROS reviewed and all otherwise negative except for mentioned in HPI    Allergies  Pork-derived products and Heparin (porcine)  Home Medications   Prior to Admission medications   Medication Sig Start Date End Date Taking? Authorizing Provider  cyclobenzaprine (FLEXERIL) 5 MG tablet Take 5 mg by mouth 3 (three) times daily.  01/24/15 02/03/15 Yes Historical Provider, MD  gabapentin (NEURONTIN) 600 MG tablet Take 600 mg by mouth 2 (two) times daily.    Yes Historical Provider, MD  lisinopril (PRINIVIL,ZESTRIL) 5 MG tablet Take 5 mg by mouth daily.  01/24/15  Yes Historical Provider, MD  metFORMIN (GLUCOPHAGE) 1000 MG tablet Take 1,000 mg by mouth 2 (two) times daily with a meal. 12/27/14  Yes Historical Provider, MD  pravastatin (PRAVACHOL) 40 MG tablet Take 40 mg by mouth daily.  01/24/15  Yes Historical Provider, MD  ibuprofen (ADVIL,MOTRIN) 600 MG tablet Take 1 tablet (600 mg total) by mouth every 6 (six) hours as needed.  Patient not taking: Reported on 01/25/2015 06/20/14   Hoyle Sauer, MD  loratadine (CLARITIN) 10 MG tablet Take 1 tablet (10 mg total) by mouth daily. Patient not taking: Reported on 01/25/2015 6/76/19   Delora Fuel, MD  oxyCODONE-acetaminophen (PERCOCET) 10-325 MG per tablet Take 1 tablet by mouth every 4 (four) hours as needed for pain. 01/25/15   Alfonzo Beers, MD  predniSONE (DELTASONE) 50 MG tablet Take 1 tablet (50 mg total) by mouth daily. 2 tabs po daily x 3 days Patient not taking: Reported on 01/25/2015 03/20/31   Delora Fuel, MD   BP 67/12 mmHg  Pulse 91  Temp(Src) 98.4 F (36.9 C) (Oral)  Resp 18  SpO2 94%  Vitals reviewed Physical Exam  Physical Examination: General appearance - alert, well appearing, and in no distress Mental status - alert, oriented to person, place, and time Eyes - no conjunctival injection, no scleral  icterus Neck - no midline tenderness to palpation, ttp over bilateral paraspinous muscle distribution with radiation to trapezius distribution bilaterally, no meningismus Chest - clear to auscultation, no wheezes, rales or rhonchi, symmetric air entry Heart - normal rate, regular rhythm, normal S1, S2, no murmurs, rubs, clicks or gallops Abdomen - soft, nontender, nondistended, no masses or organomegaly Extremities - peripheral pulses normal, no pedal edema, no clubbing or cyanosis, s/p LLE amputation Skin - normal coloration and turgor, no rashes  ED Course  Procedures (including critical care time) Labs Review Labs Reviewed  CBC WITH DIFFERENTIAL/PLATELET - Abnormal; Notable for the following:    Hemoglobin 11.4 (*)    HCT 35.9 (*)    RDW 19.4 (*)    All other components within normal limits  COMPREHENSIVE METABOLIC PANEL - Abnormal; Notable for the following:    Glucose, Bld 142 (*)    Albumin 2.9 (*)    All other components within normal limits  URINALYSIS, ROUTINE W REFLEX MICROSCOPIC - Abnormal; Notable for the following:    Color, Urine AMBER (*)    All other components within normal limits    Imaging Review No results found.   EKG Interpretation None      MDM   Final diagnoses:  Musculoskeletal pain    Pt presenting with request for pain medication due to diffuse pain.  Pt has hx of chronic pain and has been on multiple pain meds in the past.  Currently only on gabapentin based on the prescriptions he brings into the ED.  Neck pain is not midline, no meningismus, musculoskeletal in nature.  Pt given rx for oxycodone, recommended close followup with PMD. Discharged with strict return precautions.  Pt agreeable with plan.    Alfonzo Beers, MD 01/25/15 [phone]

## 2015-02-15 ENCOUNTER — Emergency Department (HOSPITAL_COMMUNITY): Payer: Medicaid Other

## 2015-02-15 ENCOUNTER — Emergency Department (HOSPITAL_COMMUNITY)
Admission: EM | Admit: 2015-02-15 | Discharge: 2015-02-15 | Disposition: A | Discharge status: Home / Self Care | Payer: Medicaid Other | Attending: Emergency Medicine | Admitting: Emergency Medicine

## 2015-02-15 ENCOUNTER — Encounter (HOSPITAL_COMMUNITY): Payer: Self-pay | Admitting: Emergency Medicine

## 2015-02-15 DIAGNOSIS — Z8719 Personal history of other diseases of the digestive system: Secondary | ICD-10-CM | POA: Insufficient documentation

## 2015-02-15 DIAGNOSIS — Z79899 Other long term (current) drug therapy: Secondary | ICD-10-CM | POA: Diagnosis not present

## 2015-02-15 DIAGNOSIS — R079 Chest pain, unspecified: Secondary | ICD-10-CM | POA: Diagnosis present

## 2015-02-15 DIAGNOSIS — I2510 Atherosclerotic heart disease of native coronary artery without angina pectoris: Secondary | ICD-10-CM | POA: Diagnosis not present

## 2015-02-15 DIAGNOSIS — C419 Malignant neoplasm of bone and articular cartilage, unspecified: Secondary | ICD-10-CM

## 2015-02-15 DIAGNOSIS — R509 Fever, unspecified: Secondary | ICD-10-CM

## 2015-02-15 DIAGNOSIS — I10 Essential (primary) hypertension: Secondary | ICD-10-CM | POA: Diagnosis not present

## 2015-02-15 DIAGNOSIS — J988 Other specified respiratory disorders: Secondary | ICD-10-CM | POA: Insufficient documentation

## 2015-02-15 DIAGNOSIS — Z7952 Long term (current) use of systemic steroids: Secondary | ICD-10-CM | POA: Insufficient documentation

## 2015-02-15 DIAGNOSIS — Z86718 Personal history of other venous thrombosis and embolism: Secondary | ICD-10-CM | POA: Insufficient documentation

## 2015-02-15 DIAGNOSIS — C4922 Malignant neoplasm of connective and soft tissue of left lower limb, including hip: Secondary | ICD-10-CM | POA: Diagnosis not present

## 2015-02-15 DIAGNOSIS — C7801 Secondary malignant neoplasm of right lung: Secondary | ICD-10-CM | POA: Diagnosis not present

## 2015-02-15 DIAGNOSIS — C7802 Secondary malignant neoplasm of left lung: Secondary | ICD-10-CM | POA: Insufficient documentation

## 2015-02-15 DIAGNOSIS — C7800 Secondary malignant neoplasm of unspecified lung: Secondary | ICD-10-CM

## 2015-02-15 LAB — COMPREHENSIVE METABOLIC PANEL
ALT: 28 U/L (ref 0–53)
AST: 44 U/L — ABNORMAL HIGH (ref 0–37)
Albumin: 2.9 g/dL — ABNORMAL LOW (ref 3.5–5.2)
Alkaline Phosphatase: 173 U/L — ABNORMAL HIGH (ref 39–117)
Anion gap: 12 (ref 5–15)
BUN: 6 mg/dL (ref 6–23)
CALCIUM: 8.2 mg/dL — AB (ref 8.4–10.5)
CO2: 24 mmol/L (ref 19–32)
Chloride: 102 mmol/L (ref 96–112)
Creatinine, Ser: 0.49 mg/dL — ABNORMAL LOW (ref 0.50–1.35)
GLUCOSE: 109 mg/dL — AB (ref 70–99)
POTASSIUM: 3.8 mmol/L (ref 3.5–5.1)
Sodium: 138 mmol/L (ref 135–145)
Total Bilirubin: 0.4 mg/dL (ref 0.3–1.2)
Total Protein: 7.1 g/dL (ref 6.0–8.3)

## 2015-02-15 LAB — URINALYSIS, ROUTINE W REFLEX MICROSCOPIC
BILIRUBIN URINE: NEGATIVE
GLUCOSE, UA: NEGATIVE mg/dL
Hgb urine dipstick: NEGATIVE
KETONES UR: NEGATIVE mg/dL
Leukocytes, UA: NEGATIVE
Nitrite: NEGATIVE
PROTEIN: NEGATIVE mg/dL
Specific Gravity, Urine: 1.006 (ref 1.005–1.030)
Urobilinogen, UA: 0.2 mg/dL (ref 0.0–1.0)
pH: 7 (ref 5.0–8.0)

## 2015-02-15 LAB — CBC WITH DIFFERENTIAL/PLATELET
BASOS ABS: 0.1 10*3/uL (ref 0.0–0.1)
Basophils Relative: 1 % (ref 0–1)
Eosinophils Absolute: 0.2 10*3/uL (ref 0.0–0.7)
Eosinophils Relative: 5 % (ref 0–5)
HEMATOCRIT: 38.3 % — AB (ref 39.0–52.0)
Hemoglobin: 11.9 g/dL — ABNORMAL LOW (ref 13.0–17.0)
LYMPHS PCT: 27 % (ref 12–46)
Lymphs Abs: 1.3 10*3/uL (ref 0.7–4.0)
MCH: 25.5 pg — ABNORMAL LOW (ref 26.0–34.0)
MCHC: 31.1 g/dL (ref 30.0–36.0)
MCV: 82 fL (ref 78.0–100.0)
MONOS PCT: 8 % (ref 3–12)
Monocytes Absolute: 0.4 10*3/uL (ref 0.1–1.0)
NEUTROS ABS: 2.9 10*3/uL (ref 1.7–7.7)
Neutrophils Relative %: 59 % (ref 43–77)
Platelets: 103 10*3/uL — ABNORMAL LOW (ref 150–400)
RBC: 4.67 MIL/uL (ref 4.22–5.81)
RDW: 18 % — ABNORMAL HIGH (ref 11.5–15.5)
WBC: 4.9 10*3/uL (ref 4.0–10.5)

## 2015-02-15 LAB — I-STAT TROPONIN, ED: TROPONIN I, POC: 0 ng/mL (ref 0.00–0.08)

## 2015-02-15 LAB — I-STAT CG4 LACTIC ACID, ED: Lactic Acid, Venous: 1.52 mmol/L (ref 0.5–2.0)

## 2015-02-15 MED ORDER — ACETAMINOPHEN 325 MG PO TABS
650.0000 mg | ORAL_TABLET | Freq: Four times a day (QID) | ORAL | Status: DC | PRN
  Administered 2015-02-15: 650 mg via ORAL
  Filled 2015-02-15 (×2): qty 2

## 2015-02-15 MED ORDER — SODIUM CHLORIDE 0.9 % IV BOLUS (SEPSIS)
1000.0000 mL | Freq: Once | INTRAVENOUS | Status: AC
  Administered 2015-02-15: 1000 mL via INTRAVENOUS

## 2015-02-15 MED ORDER — IOHEXOL 350 MG/ML SOLN
100.0000 mL | Freq: Once | INTRAVENOUS | Status: AC | PRN
  Administered 2015-02-15: 80 mL via INTRAVENOUS

## 2015-02-15 NOTE — ED Notes (Signed)
Pt unable to provide urine sample at present time

## 2015-02-15 NOTE — ED Notes (Signed)
Bed: TS17 Expected date:  Expected time:  Means of arrival:  Comments: EMS-cancer patient, pain

## 2015-02-15 NOTE — Progress Notes (Signed)
pcp is [facility] [place], [place] [id number]  [phone]

## 2015-02-15 NOTE — ED Notes (Addendum)
Per interpreter Lennette Bihari pt reports generalized pain for 2.5 months; pt reports did not come earlier because had no one to take him. Pt last chemo treatment this morning. Pt continues to report right side chest pain with movement and worsening productive cough for 2 weeks.

## 2015-02-15 NOTE — ED Notes (Signed)
Per EMS-patient being treated for cancer at [facility]-complaining of pain all over

## 2015-02-15 NOTE — Discharge Instructions (Signed)
Fiebre en el adulto  (Fever, Adult)  Se considera fiebre a la temperatura de 100.30F (38C) o ms.  CUIDADOS EN EL HOGAR   Tome los [employer] indic el mdico. No  tome aspirina para bajar la fiebre si tiene menos de 19 aos.  Si le han recetado antibiticos, tmelos segn las indicaciones. Finalice el [place], aunque comience a Sports administrator.  Haga reposo.  Beba gran cantidad de lquido para mantener el pis (orina) de tono claro o amarillo plido. No consuma alcohol.  Tome un bao o una ducha con agua a Engineer, water. No use agua con hielo ni se pase esponjas con alcohol.  Use ropas ligeras y sueltas. SOLICITE AYUDA DE INMEDIATO SI:   Siente falta de aire o dificultad para respirar.  Se siente muy dbil.  Se siente mareado o se desvanece (se desmaya).  Tiene mucha sed u observa que orina poco o no orina en absoluto.  Siente un dolor nuevo.  Vomita o tiene deposiciones acuosas (diarrea).  Sigue vomitando o tiene diarrea durante ms de 1 o 2 das.  Siente rigidez en el cuello o le [employer].  Le aparece una erupcin cutnea.  Tiene fiebre o problemas (sntomas) que persisten durante ms de 2 o 3 das.  Tiene fiebre y los sntomas empeoran rpidamente.  Sigue vomitando los lquidos que ingiere.  No mejora luego de 3 das.  Tiene nuevos sntomas. ASEGRESE DE QUE:   Comprende estas instrucciones.  Controlar su enfermedad.  Solicitar ayuda de inmediato si no mejora o si empeora. Document Released: 07/22/2012 [facility] Patient Information 2015 [place]. This information is not intended to replace advice given to you by your health care provider. Make sure you discuss any questions you have with your health care provider.   Please rest, drink plenty fluids, and use ibuprofen or Tylenol as needed for fever. His contact her primary care provider and oncologist and inform them of the recent emergency room visit. Salt problem  information with him including laboratory results. Monitor for worsening signs or symptoms and return to the emergency room if needed.

## 2015-02-15 NOTE — ED Provider Notes (Signed)
CSN: [id number]     Arrival date & time 02/15/15  1551 History   First MD Initiated Contact with Patient 02/15/15 1601     Chief Complaint  Patient presents with  . Pain    HPI   59 year old male presents today with acute worsening of his cough and chest pain. 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 Kendall Flack. She reports they were seen by him last week, with most recent chemotherapy treatment this morning. Patient reports a two-week history of cough that has acutely worsened over the last 2 days with increased sputum production, blood, with the addition of fever today. Patient additionally notes weakness, and diffuse body pain. He was last seen here on 01/24/2014 for body pain that included bilateral cervical pain, right knee pain, and diffuse muscular aches. No acute changes in his pain today, the exception of the chest pain. Patient reports the pain is worse with deep inspirations. Patient denies recent history of upper respiratory infections, and reports no changes in his baseline status. No recent hospitalizations, abdominal pain, changes in urination characteristics or frequency, lower extremity swelling or edema. Patient reports decreased appetite over the last 2 days with minimal by mouth intake.  Review of patient's recent visit to [facility] health showed recent MRI head with no signs of metastasis. Recent CT of chest, abdomen, lower extremities show metastasis to the right femur and lower lumbar. Patient did receive chemotherapy this morning  Past Medical History  Diagnosis Date  . Hypertension   . Cancer     Dr. Kendall Flack at [facility] is H/O; BONE CA-- CHEMO; pleomorphic sarcoma left leg/hip  . Coronary atherosclerosis     per CT 07/13/13 at [facility]  . Cholelithiases     per CT 07/13/13  . Fatty liver     per CT 07/13/13 at [facility]  . Pulmonary nodules      per CT 07/13/13 at [facility]; multiple b/l   . DVT (deep venous thrombosis)     dx 9/18 right IJ and R SCV on Xarelto   Past Surgical History  Procedure Laterality Date  . Other surgical history      renal stone  . Other surgical history      FNA  . Other surgical history      Bx of left pelvic mass iliac bone   . Leg amputation Left    Family History  Problem Relation Age of Onset  . Cancer Mother   . Diabetes Sister   . Other      grandaughter with immature teratoma   History  Substance Use Topics  . Smoking status: Never Smoker   . Smokeless tobacco: Never Used  . Alcohol Use: No    Review of Systems  All other systems reviewed and are negative.   Allergies  Pork-derived products and Heparin (porcine)  Home Medications   Prior to Admission medications   Medication Sig Start Date End Date Taking? Authorizing Provider  gabapentin (NEURONTIN) 600 MG tablet Take 600 mg by mouth 2 (two) times daily.     Historical Provider, MD  ibuprofen (ADVIL,MOTRIN) 600 MG tablet Take 1 tablet (600 mg total) by mouth every 6 (six) hours as needed. Patient not taking: Reported on 01/25/2015 06/20/14   Hoyle Sauer, MD  lisinopril (PRINIVIL,ZESTRIL) 5 MG tablet Take 5 mg by mouth daily.  01/24/15   Historical  Provider, MD  loratadine (CLARITIN) 10 MG tablet Take 1 tablet (10 mg total) by mouth daily. Patient not taking: Reported on 01/25/2015 0/62/69   Delora Fuel, MD  metFORMIN (GLUCOPHAGE) 1000 MG tablet Take 1,000 mg by mouth 2 (two) times daily with a meal. 12/27/14   Historical Provider, MD  oxyCODONE-acetaminophen (PERCOCET) 10-325 MG per tablet Take 1 tablet by mouth every 4 (four) hours as needed for pain. 01/25/15   Alfonzo Beers, MD  pravastatin (PRAVACHOL) 40 MG tablet Take 40 mg by mouth daily.  01/24/15   Historical Provider, MD  predniSONE (DELTASONE) 50 MG tablet Take 1 tablet (50 mg total) by mouth daily. 2 tabs po daily x 3 days Patient not taking: Reported on 01/25/2015 4/85/46    Delora Fuel, MD   BP 270/35 mmHg  Pulse 106  Temp(Src) 102 F (38.9 C) (Rectal)  SpO2 96% Physical Exam  Constitutional: He is oriented to person, place, and time. He appears well-developed and well-nourished.  HENT:  Head: Normocephalic and atraumatic.  Right Ear: Hearing, tympanic membrane, external ear and ear canal normal.  Left Ear: Hearing, tympanic membrane, external ear and ear canal normal.  Eyes: Pupils are equal, round, and reactive to light.  Neck: Normal range of motion. Neck supple. No JVD present. No tracheal deviation present. No thyromegaly present.  Pain with flexion of the neck  Cardiovascular: Regular rhythm, normal heart sounds and intact distal pulses.  Exam reveals no gallop and no friction rub.   No murmur heard. Pulmonary/Chest: Effort normal. No stridor. No respiratory distress. He has wheezes. He exhibits tenderness.  Abdominal: Soft. Bowel sounds are normal. He exhibits no distension and no mass. There is no tenderness. There is no rebound and no guarding.  Musculoskeletal: Normal range of motion.  Left leg below the knee amputation  Lymphadenopathy:    He has no cervical adenopathy.  Neurological: He is alert and oriented to person, place, and time. Coordination normal.  Skin: Skin is warm and dry.  No signs of rashes  Psychiatric: He has a normal mood and affect. His behavior is normal. Judgment and thought content normal.  Nursing note and vitals reviewed.   ED Course  Procedures (including critical care time) Labs Review Labs Reviewed  CULTURE, BLOOD (ROUTINE X 2)  CULTURE, BLOOD (ROUTINE X 2)  URINE CULTURE  CBC WITH DIFFERENTIAL/PLATELET  COMPREHENSIVE METABOLIC PANEL  URINALYSIS, ROUTINE W REFLEX MICROSCOPIC  I-STAT CG4 LACTIC ACID, ED  I-STAT TROPOININ, ED    Imaging Review No results found.   EKG Interpretation None      MDM   Final diagnoses:  Fever   Labs: Urinalysis, lactic acid, troponin, CBC, CMP- no significant  findings  Imaging: CT angiography no evidence of PE, interval progression of bilateral pulmonary metastases, decreased in size and mediastinal and bilateral hilar lymph nodes  DG chest no evidence of pneumonia  Consults: None   Therapeutics: Sodium chloride, Tylenol  Assessment: Respiratory infection with no signs of pneumonia  Plan: Patient's pain is been chronic for the last 2-1/2 months with no acute changes. 2 month history of upper respiratory symptoms with the addition of fever today. Fever reduced with antipyretics today. Patient reports he feels better after that with saline. Patient's labs and imaging did not reveal source additional source of infection in addition to respiratory infection. No signs of bacterial pneumonia present likely viral. Patient also received chemotherapy this morning. Patient is encouraged to use Tylenol or ibuprofen drink plenty fluids, rest and contact their  oncologist tomorrow and inform them of the recent ED visit and share all pertinent information. Patient does not have signs or symptoms that would indicate sepsis or systemic spread of infection at this time. He is instructed to monitor for new or worsening signs or symptoms to return immediately if any present. Interpreter was used to explain this to the patient and his family, who understood and agreed to the plan.      Okey Regal, PA-C 02/16/15 0124  Virgel Manifold, MD 02/20/15 [phone]

## 2015-02-17 ENCOUNTER — Emergency Department (HOSPITAL_COMMUNITY): Payer: Medicaid Other

## 2015-02-17 ENCOUNTER — Emergency Department (HOSPITAL_COMMUNITY)
Admission: EM | Admit: 2015-02-17 | Discharge: 2015-02-17 | Disposition: A | Discharge status: Home / Self Care | Payer: Medicaid Other | Attending: Emergency Medicine | Admitting: Emergency Medicine

## 2015-02-17 ENCOUNTER — Encounter (HOSPITAL_COMMUNITY): Payer: Self-pay | Admitting: Emergency Medicine

## 2015-02-17 DIAGNOSIS — J40 Bronchitis, not specified as acute or chronic: Secondary | ICD-10-CM

## 2015-02-17 DIAGNOSIS — J209 Acute bronchitis, unspecified: Secondary | ICD-10-CM | POA: Diagnosis not present

## 2015-02-17 DIAGNOSIS — Z7952 Long term (current) use of systemic steroids: Secondary | ICD-10-CM | POA: Diagnosis not present

## 2015-02-17 DIAGNOSIS — I10 Essential (primary) hypertension: Secondary | ICD-10-CM | POA: Diagnosis not present

## 2015-02-17 DIAGNOSIS — Z79899 Other long term (current) drug therapy: Secondary | ICD-10-CM | POA: Diagnosis not present

## 2015-02-17 DIAGNOSIS — Z859 Personal history of malignant neoplasm, unspecified: Secondary | ICD-10-CM | POA: Diagnosis not present

## 2015-02-17 DIAGNOSIS — I2510 Atherosclerotic heart disease of native coronary artery without angina pectoris: Secondary | ICD-10-CM | POA: Diagnosis not present

## 2015-02-17 DIAGNOSIS — Z8719 Personal history of other diseases of the digestive system: Secondary | ICD-10-CM | POA: Diagnosis not present

## 2015-02-17 DIAGNOSIS — Z86718 Personal history of other venous thrombosis and embolism: Secondary | ICD-10-CM | POA: Diagnosis not present

## 2015-02-17 DIAGNOSIS — R05 Cough: Secondary | ICD-10-CM | POA: Diagnosis present

## 2015-02-17 LAB — CBC WITH DIFFERENTIAL/PLATELET
BASOS ABS: 0 10*3/uL (ref 0.0–0.1)
Basophils Relative: 0 % (ref 0–1)
EOS ABS: 0.2 10*3/uL (ref 0.0–0.7)
Eosinophils Relative: 3 % (ref 0–5)
HEMATOCRIT: 36.2 % — AB (ref 39.0–52.0)
Hemoglobin: 11.7 g/dL — ABNORMAL LOW (ref 13.0–17.0)
LYMPHS ABS: 1.5 10*3/uL (ref 0.7–4.0)
Lymphocytes Relative: 29 % (ref 12–46)
MCH: 26.2 pg (ref 26.0–34.0)
MCHC: 32.3 g/dL (ref 30.0–36.0)
MCV: 81.2 fL (ref 78.0–100.0)
MONO ABS: 0.3 10*3/uL (ref 0.1–1.0)
Monocytes Relative: 6 % (ref 3–12)
NEUTROS ABS: 3.1 10*3/uL (ref 1.7–7.7)
Neutrophils Relative %: 62 % (ref 43–77)
PLATELETS: 102 10*3/uL — AB (ref 150–400)
RBC: 4.46 MIL/uL (ref 4.22–5.81)
RDW: 18.2 % — ABNORMAL HIGH (ref 11.5–15.5)
WBC: 5.1 10*3/uL (ref 4.0–10.5)

## 2015-02-17 LAB — I-STAT CG4 LACTIC ACID, ED
LACTIC ACID, VENOUS: 1.16 mmol/L (ref 0.5–2.0)
Lactic Acid, Venous: 1.23 mmol/L (ref 0.5–2.0)

## 2015-02-17 LAB — URINALYSIS, ROUTINE W REFLEX MICROSCOPIC
BILIRUBIN URINE: NEGATIVE
GLUCOSE, UA: NEGATIVE mg/dL
HGB URINE DIPSTICK: NEGATIVE
Ketones, ur: NEGATIVE mg/dL
LEUKOCYTES UA: NEGATIVE
NITRITE: NEGATIVE
PROTEIN: 30 mg/dL — AB
Specific Gravity, Urine: 1.019 (ref 1.005–1.030)
Urobilinogen, UA: 1 mg/dL (ref 0.0–1.0)
pH: 7 (ref 5.0–8.0)

## 2015-02-17 LAB — URINE CULTURE

## 2015-02-17 LAB — URINE MICROSCOPIC-ADD ON

## 2015-02-17 LAB — I-STAT CHEM 8, ED
BUN: 7 mg/dL (ref 6–23)
CALCIUM ION: 1.06 mmol/L — AB (ref 1.13–1.30)
Chloride: 107 mmol/L (ref 96–112)
Creatinine, Ser: 0.4 mg/dL — ABNORMAL LOW (ref 0.50–1.35)
GLUCOSE: 115 mg/dL — AB (ref 70–99)
HEMATOCRIT: 35 % — AB (ref 39.0–52.0)
HEMOGLOBIN: 11.9 g/dL — AB (ref 13.0–17.0)
Potassium: 3.4 mmol/L — ABNORMAL LOW (ref 3.5–5.1)
Sodium: 140 mmol/L (ref 135–145)
TCO2: 19 mmol/L (ref 0–100)

## 2015-02-17 MED ORDER — SODIUM CHLORIDE 0.9 % IV BOLUS (SEPSIS)
1000.0000 mL | Freq: Once | INTRAVENOUS | Status: AC
  Administered 2015-02-17: 1000 mL via INTRAVENOUS

## 2015-02-17 MED ORDER — DOXYCYCLINE HYCLATE 100 MG PO TABS
100.0000 mg | ORAL_TABLET | Freq: Once | ORAL | Status: AC
  Administered 2015-02-17: 100 mg via ORAL
  Filled 2015-02-17: qty 1

## 2015-02-17 MED ORDER — ALBUTEROL SULFATE (2.5 MG/3ML) 0.083% IN NEBU
5.0000 mg | INHALATION_SOLUTION | Freq: Once | RESPIRATORY_TRACT | Status: AC
  Administered 2015-02-17: 5 mg via RESPIRATORY_TRACT
  Filled 2015-02-17: qty 6

## 2015-02-17 MED ORDER — ALBUTEROL SULFATE HFA 108 (90 BASE) MCG/ACT IN AERS: 1.0000 | INHALATION_SPRAY | Freq: Four times a day (QID) | RESPIRATORY_TRACT | Status: AC | PRN

## 2015-02-17 MED ORDER — ALBUTEROL SULFATE (2.5 MG/3ML) 0.083% IN NEBU
5.0000 mg | INHALATION_SOLUTION | Freq: Once | RESPIRATORY_TRACT | Status: AC
  Administered 2015-02-17: 5 mg via RESPIRATORY_TRACT

## 2015-02-17 MED ORDER — DOXYCYCLINE HYCLATE 100 MG PO CAPS: 100.0000 mg | ORAL_CAPSULE | Freq: Two times a day (BID) | ORAL | Status: DC

## 2015-02-17 MED ORDER — ALBUTEROL SULFATE (2.5 MG/3ML) 0.083% IN NEBU
INHALATION_SOLUTION | RESPIRATORY_TRACT | Status: AC
  Filled 2015-02-17: qty 6

## 2015-02-17 NOTE — ED Notes (Signed)
Bed: RA30 Expected date:  Expected time:  Means of arrival:  Comments: EMS cough, congestion, fever

## 2015-02-17 NOTE — ED Notes (Addendum)
Pt arrived tyo the ED via EMS with a complaint of cough and fever.  Pt seen earlier today for same and released but was unable to obtain his prescription due to pharmacy error.  Pt has a productive cough but has trouble with forceful coughing.  Pt also is febrile upon arrival.  Pt states he had a progressively worsening cough and couldn't sleep.  Pt has rhonchi in all lung fields.  Pt complains of generalized pain staertin on his head and progressing all over his body.

## 2015-02-17 NOTE — ED Provider Notes (Signed)
CSN: [id number]     Arrival date & time 02/17/15  0256 History   First MD Initiated Contact with Patient 02/17/15 [phone]     Chief Complaint  Patient presents with  . Fever     (Consider location/radiation/quality/duration/timing/severity/associated sxs/prior Treatment) Patient is a 59 y.o. male presenting with fever. The history is provided by the patient and the spouse.  Fever Temp source:  Subjective and oral Severity:  Moderate Onset quality:  Gradual Timing:  Intermittent Progression:  Unchanged Chronicity:  Recurrent Relieved by:  Nothing Worsened by:  Nothing tried Ineffective treatments:  None tried Associated symptoms: cough   Associated symptoms: no chest pain, no chills, no confusion, no diarrhea, no rash, no rhinorrhea, no somnolence and no vomiting   Risk factors: hx of cancer     Past Medical History  Diagnosis Date  . Hypertension   . Cancer     Dr. Kendall Flack at [facility] is H/O; BONE CA-- CHEMO; pleomorphic sarcoma left leg/hip  . Coronary atherosclerosis     per CT 07/13/13 at [facility]  . Cholelithiases     per CT 07/13/13  . Fatty liver     per CT 07/13/13 at [facility]  . Pulmonary nodules     per CT 07/13/13 at [facility]; multiple b/l   . DVT (deep venous thrombosis)     dx 9/18 right IJ and R SCV on Xarelto   Past Surgical History  Procedure Laterality Date  . Other surgical history      renal stone  . Other surgical history      FNA  . Other surgical history      Bx of left pelvic mass iliac bone   . Leg amputation Left    Family History  Problem Relation Age of Onset  . Cancer Mother   . Diabetes Sister   . Other      grandaughter with immature teratoma   History  Substance Use Topics  . Smoking status: Never Smoker   . Smokeless tobacco: Never Used  . Alcohol Use: No    Review of Systems  Constitutional: Positive for fever. Negative for chills.  HENT: Negative for rhinorrhea.   Respiratory: Positive for cough.   Cardiovascular: Negative for chest pain,  palpitations and leg swelling.  Gastrointestinal: Negative for vomiting and diarrhea.  Skin: Negative for rash.  Psychiatric/Behavioral: Negative for confusion.  All other systems reviewed and are negative.     Allergies  Pork-derived products and Heparin (porcine)  Home Medications   Prior to Admission medications   Medication Sig Start Date End Date Taking? Authorizing Provider  acetaminophen (TYLENOL) 650 MG CR tablet Take 650 mg by mouth every 4 (four) hours as needed for pain.   Yes Historical Provider, MD  everolimus (AFINITOR) 5 MG tablet Take 10 mg by mouth daily. 02/14/15  Yes Historical Provider, MD  gabapentin (NEURONTIN) 600 MG tablet Take 600 mg by mouth 2 (two) times daily.    Yes Historical Provider, MD  hydrOXYzine (ATARAX/VISTARIL) 25 MG tablet Take 25 mg by mouth 3 (three) times daily as needed for itching.   Yes Historical Provider, MD  lisinopril (PRINIVIL,ZESTRIL) 5 MG tablet Take 5 mg by mouth daily.  01/24/15  Yes Historical Provider, MD  metFORMIN (GLUCOPHAGE) 1000 MG tablet Take 1,000 mg by mouth 2 (two) times daily with a meal. 12/27/14  Yes Historical Provider, MD  OxyCODONE (OXYCONTIN) 10 mg T12A 12 hr tablet Take 10 mg by mouth every 12 (twelve) hours.   Yes  Historical Provider, MD  pravastatin (PRAVACHOL) 40 MG tablet Take 40 mg by mouth daily.  01/24/15  Yes Historical Provider, MD  SORAfenib (NEXAVAR) 200 MG tablet Take 200 mg by mouth 2 (two) times daily.  02/10/15  Yes Historical Provider, MD  ibuprofen (ADVIL,MOTRIN) 600 MG tablet Take 1 tablet (600 mg total) by mouth every 6 (six) hours as needed. Patient not taking: Reported on 01/25/2015 06/20/14   Hoyle Sauer, MD  loratadine (CLARITIN) 10 MG tablet Take 1 tablet (10 mg total) by mouth daily. Patient not taking: Reported on 01/25/2015 8/34/19   Delora Fuel, MD  oxyCODONE-acetaminophen (PERCOCET) 10-325 MG per tablet Take 1 tablet by mouth every 4 (four) hours as needed for pain. Patient not taking:  Reported on 02/15/2015 01/25/15   Alfonzo Beers, MD  predniSONE (DELTASONE) 50 MG tablet Take 1 tablet (50 mg total) by mouth daily. 2 tabs po daily x 3 days Patient not taking: Reported on 01/25/2015 05/02/28   Delora Fuel, MD   BP 798/921 mmHg  Pulse 115  Temp(Src) 99.8 F (37.7 C) (Oral)  Resp 22  Wt 109 lb (49.442 kg)  SpO2 97% Physical Exam  Constitutional: He is oriented to person, place, and time. He appears well-developed and well-nourished. No distress.  HENT:  Head: Normocephalic and atraumatic.  Mouth/Throat: Oropharynx is clear and moist.  Eyes: Conjunctivae are normal. Pupils are equal, round, and reactive to light.  Neck: Normal range of motion. Neck supple.  Cardiovascular: Normal rate, regular rhythm and intact distal pulses.   Pulmonary/Chest: Effort normal. No respiratory distress. He has no wheezes. He has rhonchi. He has no rales.  Abdominal: Soft. Bowel sounds are normal. There is no tenderness. There is no rebound and no guarding.  Musculoskeletal: Normal range of motion.  Lymphadenopathy:    He has no cervical adenopathy.  Neurological: He is alert and oriented to person, place, and time.  Skin: Skin is warm and dry.  Psychiatric: He has a normal mood and affect.    ED Course  Procedures (including critical care time) Labs Review Labs Reviewed  CBC WITH DIFFERENTIAL/PLATELET - Abnormal; Notable for the following:    Hemoglobin 11.7 (*)    HCT 36.2 (*)    RDW 18.2 (*)    Platelets 102 (*)    All other components within normal limits  I-STAT CHEM 8, ED - Abnormal; Notable for the following:    Potassium 3.4 (*)    Creatinine, Ser 0.40 (*)    Glucose, Bld 115 (*)    Calcium, Ion 1.06 (*)    Hemoglobin 11.9 (*)    HCT 35.0 (*)    All other components within normal limits  CULTURE, BLOOD (ROUTINE X 2)  CULTURE, BLOOD (ROUTINE X 2)  URINE CULTURE  URINALYSIS, ROUTINE W REFLEX MICROSCOPIC  I-STAT CG4 LACTIC ACID, ED    Imaging Review Dg Chest 2  View  02/17/2015   CLINICAL DATA:  Productive cough and fever for 24 hours.  EXAM: CHEST  2 VIEW  COMPARISON:  02/15/2015  FINDINGS: Bilateral pulmonary metastases. No change since previous study. Shallow inspiration. Normal heart size and pulmonary vascularity. No consolidation or airspace disease in the lungs. No blunting of costophrenic angles. No pneumothorax. Power port type central venous catheter remains in place with tip over the cavoatrial junction. No change since previous study.  IMPRESSION: Bilateral pulmonary metastasis appear stable since previous study. No evidence of focal consolidation or airspace disease.   Electronically Signed   By: Gwyndolyn Saxon  Gerilyn Nestle M.D.   On: 02/17/2015 04:24   Dg Chest 2 View  02/15/2015   CLINICAL DATA:  Fever.  Undergoing treatment for cancer.  EXAM: CHEST  2 VIEW  COMPARISON:  06/19/2014  FINDINGS: Right IJ porta catheter, tip in the right atrium, stable from prior. No change in heart size. There is widening of the upper mediastinum which is suspicious for progressive lymphadenopathy. Bilateral pulmonary metastases which have enlarged from previous, most notably a left upper lobe metastasis which is now 46 mm, previously 38 mm. There is no evidence for superimposed pneumonia. No edema, effusion, or pneumothorax.  IMPRESSION: 1. No evidence for pneumonia. 2. Intrathoracic malignancy with progression since 06/19/2014.   Electronically Signed   By: Monte Fantasia M.D.   On: 02/15/2015 17:00   Ct Angio Chest Pe W/cm &/or Wo Cm  02/15/2015   CLINICAL DATA:  Generalize pain for 2 and a have months. Metastatic sarcoma.  EXAM: CT ANGIOGRAPHY CHEST WITH CONTRAST  TECHNIQUE: Multidetector CT imaging of the chest was performed using the standard protocol during bolus administration of intravenous contrast. Multiplanar CT image reconstructions and MIPs were obtained to evaluate the vascular anatomy.  CONTRAST:  36mL OMNIPAQUE IOHEXOL 350 MG/ML SOLN  COMPARISON:  None.  FINDINGS:  Mediastinum: The heart size is normal. There is no pericardial effusion. The trachea appears patent and is midline. Normal appearance of the esophagus. Sub- carinal lymph node measures 9 mm, image 45/series 4. Previously 1.3 cm. Left hilar lymph node measures 8 mm, image 45/series 4. Previously 1 cm. Index left hilar node measures 1.3 cm, image 48 of series 4. Previously 1.7 cm. The main pulmonary artery is patent. No lobar or segmental pulmonary artery filling defects identified to suggest a clinically significant acute pulmonary embolus.  Lungs/Pleura: There is no pleural effusion noted. Left upper lobe mass measures 4.1 cm, image 28/ series 11. Previously 3.6 cm. Mass in the right middle lobe measures 2.7 x 2.5 cm, image 39/series 6. Previously 1.3 x 1.4 cm. Right upper lobe tumor measures 2.5 cm, image number 28/ series 6. Previously 2.1 cm.  Upper Abdomen: There is diffuse hepatic steatosis noted. The adrenal glands appear normal. The visualized portions of the spleen and pancreas are normal.  Musculoskeletal: No aggressive lytic or sclerotic bone lesion. Review of the visualized osseous structures is negative for aggressive lytic or sclerotic bone lesion.  Review of the MIP images confirms the above findings.  IMPRESSION: 1. No evidence for acute pulmonary embolus. 2. Interval progression of bilateral pulmonary metastasis. 3. Decrease in size of mediastinal and bilateral hilar lymph nodes.   Electronically Signed   By: Kerby Moors M.D.   On: 02/15/2015 19:44     EKG Interpretation None      MDM   Final diagnoses:  None    Medications  sodium chloride 0.9 % bolus 1,000 mL (0 mLs Intravenous Stopped 02/17/15 0537)  sodium chloride 0.9 % bolus 1,000 mL (1,000 mLs Intravenous New Bag/Given 02/17/15 0537)  albuterol (PROVENTIL) (2.5 MG/3ML) 0.083% nebulizer solution 5 mg (5 mg Nebulization Given 02/17/15 0545)  albuterol (PROVENTIL) (2.5 MG/3ML) 0.083% nebulizer solution 5 mg (5 mg Nebulization Given  02/17/15 0612)  albuterol (PROVENTIL) (2.5 MG/3ML) 0.083% nebulizer solution (  Duplicate 01/11/81 5053)  doxycycline (VIBRA-TABS) tablet 100 mg (100 mg Oral Given 02/17/15 0703)   Cultures negative labs today unremarkable, CT angio within 36 hours is negative for PE, moving air well post nebs and coughing up clear colorless sputum.  No change  in white count.  Afebrile here.  Will start doxycycline BID x 7 days and albuterol as outpatient and patient is instructed he must follow up with his oncologist at [place], MD 02/17/15 [phone]

## 2015-02-18 LAB — URINE CULTURE
COLONY COUNT: NO GROWTH
CULTURE: NO GROWTH
Special Requests: NORMAL

## 2015-02-22 LAB — CULTURE, BLOOD (ROUTINE X 2)
CULTURE: NO GROWTH
CULTURE: NO GROWTH

## 2015-02-23 LAB — CULTURE, BLOOD (ROUTINE X 2)
CULTURE: NO GROWTH
Culture: NO GROWTH

## 2015-07-17 ENCOUNTER — Emergency Department (HOSPITAL_COMMUNITY)
Admission: EM | Admit: 2015-07-17 | Discharge: 2015-07-18 | Disposition: A | Discharge status: Other Acute Inpatient Hospital | Payer: Medicaid Other | Attending: Emergency Medicine | Admitting: Emergency Medicine

## 2015-07-17 DIAGNOSIS — Z79899 Other long term (current) drug therapy: Secondary | ICD-10-CM | POA: Diagnosis not present

## 2015-07-17 DIAGNOSIS — C44509 Unspecified malignant neoplasm of skin of other part of trunk: Secondary | ICD-10-CM | POA: Insufficient documentation

## 2015-07-17 DIAGNOSIS — Z85828 Personal history of other malignant neoplasm of skin: Secondary | ICD-10-CM | POA: Insufficient documentation

## 2015-07-17 DIAGNOSIS — Z86718 Personal history of other venous thrombosis and embolism: Secondary | ICD-10-CM | POA: Diagnosis not present

## 2015-07-17 DIAGNOSIS — I2510 Atherosclerotic heart disease of native coronary artery without angina pectoris: Secondary | ICD-10-CM | POA: Diagnosis not present

## 2015-07-17 DIAGNOSIS — I10 Essential (primary) hypertension: Secondary | ICD-10-CM | POA: Diagnosis not present

## 2015-07-17 DIAGNOSIS — D649 Anemia, unspecified: Secondary | ICD-10-CM | POA: Insufficient documentation

## 2015-07-17 DIAGNOSIS — C762 Malignant neoplasm of abdomen: Secondary | ICD-10-CM

## 2015-07-17 DIAGNOSIS — K922 Gastrointestinal hemorrhage, unspecified: Secondary | ICD-10-CM | POA: Insufficient documentation

## 2015-07-17 DIAGNOSIS — R1084 Generalized abdominal pain: Secondary | ICD-10-CM | POA: Diagnosis present

## 2015-07-17 HISTORY — DX: Malignant neoplasm of connective and soft tissue, unspecified: C49.9

## 2015-07-17 MED ORDER — ONDANSETRON HCL 4 MG/2ML IJ SOLN
4.0000 mg | Freq: Once | INTRAMUSCULAR | Status: AC | PRN
  Administered 2015-07-18: 4 mg via INTRAVENOUS
  Filled 2015-07-17: qty 2

## 2015-07-17 MED ORDER — SODIUM CHLORIDE 0.9 % IV BOLUS (SEPSIS)
1000.0000 mL | Freq: Once | INTRAVENOUS | Status: AC
  Administered 2015-07-18: 1000 mL via INTRAVENOUS

## 2015-07-17 NOTE — ED Provider Notes (Signed)
CSN: [id number]   Arrival date & time 07/17/15 2320  History  This chart was scribed for Everlene Balls, MD by Altamease Oiler, ED Scribe. This patient was seen in room WA09/WA09 and the patient's care was started at 11:49 PM.  Chief Complaint  Patient presents with  . Emesis  . Abdominal Pain    HPI The history is provided by the patient. A language interpreter was used ([id number]).   Gregory Frazier is a 59 y.o. 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 The pain is described as cramping. Associated symptoms include nausea and emesis. Pt denies fever, chills, diarrhea, and change in urination. He denies known abdominal cancer. His oncologist is Dr. Kendall Flack at [facility].   Past Medical History  Diagnosis Date  . Hypertension   . Cancer     Dr. Kendall Flack at [facility] is H/O; BONE CA-- CHEMO; pleomorphic sarcoma left leg/hip  . Coronary atherosclerosis     per CT 07/13/13 at [facility]  . Cholelithiases     per CT 07/13/13  . Fatty liver     per CT 07/13/13 at [facility]  . Pulmonary nodules     per CT 07/13/13 at [facility]; multiple b/l   . DVT (deep venous thrombosis)     dx 9/18 right IJ and R SCV on Xarelto  . Pleomorphic cell sarcoma     Past Surgical History  Procedure Laterality Date  . Other surgical history      renal stone  . Other surgical history      FNA  . Other surgical history      Bx of left pelvic mass iliac bone   . Leg amputation Left     Family History  Problem Relation Age of Onset  . Cancer Mother   . Diabetes Sister   . Other      grandaughter with immature teratoma    Social History  Substance Use Topics  . Smoking status: Never Smoker   . Smokeless tobacco: Never Used  . Alcohol Use: No     Review of Systems 10 Systems reviewed and all are negative for acute change except as noted in the HPI. Home Medications   Prior to  Admission medications   Medication Sig Start Date End Date Taking? Authorizing Provider  acetaminophen (TYLENOL) 650 MG CR tablet Take 650 mg by mouth every 4 (four) hours as needed for pain.   Yes Historical Provider, MD  albuterol (PROVENTIL HFA;VENTOLIN HFA) 108 (90 BASE) MCG/ACT inhaler Inhale 1-2 puffs into the lungs every 6 (six) hours as needed for wheezing or shortness of breath. 02/17/15  Yes April Palumbo, MD  benzonatate (TESSALON) 100 MG capsule Take 100 mg by mouth 3 (three) times daily as needed for cough.   Yes Historical Provider, MD  citalopram (CELEXA) 20 MG tablet Take 20 mg by mouth daily.   Yes Historical Provider, MD  dexamethasone (DECADRON) 4 MG tablet Take 4 mg by mouth daily.   Yes Historical Provider, MD  OxyCODONE (OXYCONTIN) 10 mg T12A 12 hr tablet Take 10 mg by mouth every 12 (twelve) hours.   Yes Historical Provider, MD  pantoprazole (PROTONIX) 40 MG tablet Take 40 mg by mouth daily.   Yes Historical Provider, MD  pazopanib (VOTRIENT) 200 MG tablet Take 800 mg by mouth daily. Take on an empty stomach.   Yes Historical Provider, MD  pravastatin (  PRAVACHOL) 40 MG tablet Take 40 mg by mouth daily.  01/24/15  Yes Historical Provider, MD  prochlorperazine (COMPAZINE) 10 MG tablet Take 10 mg by mouth every 6 (six) hours as needed for nausea or vomiting.   Yes Historical Provider, MD  sennosides-docusate sodium (SENOKOT-S) 8.6-50 MG tablet Take 1 tablet by mouth daily.   Yes Historical Provider, MD  doxycycline (VIBRAMYCIN) 100 MG capsule Take 1 capsule (100 mg total) by mouth 2 (two) times daily. One po bid x 7 days Patient not taking: Reported on 07/18/2015 02/17/15   April Palumbo, MD  ibuprofen (ADVIL,MOTRIN) 600 MG tablet Take 1 tablet (600 mg total) by mouth every 6 (six) hours as needed. Patient not taking: Reported on 01/25/2015 06/20/14   Hoyle Sauer, MD  loratadine (CLARITIN) 10 MG tablet Take 1 tablet (10 mg total) by mouth daily. Patient not taking: Reported on  01/25/2015 7/85/88   Delora Fuel, MD  oxyCODONE-acetaminophen (PERCOCET) 10-325 MG per tablet Take 1 tablet by mouth every 4 (four) hours as needed for pain. Patient not taking: Reported on 02/15/2015 01/25/15   Alfonzo Beers, MD  predniSONE (DELTASONE) 50 MG tablet Take 1 tablet (50 mg total) by mouth daily. 2 tabs po daily x 3 days Patient not taking: Reported on 01/25/2015 03/12/76   Delora Fuel, MD    Allergies  Pork-derived products; Ativan; and Heparin (porcine)  Triage Vitals: BP 142/86 mmHg  Pulse 98  Temp(Src) 98.1 F (36.7 C) (Oral)  Resp 18  SpO2 96%  Physical Exam  Constitutional: He is oriented to person, place, and time. Vital signs are normal. He appears well-developed and well-nourished.  Non-toxic appearance. He does not appear ill. No distress.  HENT:  Head: Normocephalic and atraumatic.  Nose: Nose normal.  Mouth/Throat: Oropharynx is clear and moist. No oropharyngeal exudate.  Eyes: Conjunctivae and EOM are normal. Pupils are equal, round, and reactive to light. No scleral icterus.  Neck: Normal range of motion. Neck supple. No tracheal deviation, no edema, no erythema and normal range of motion present. No thyroid mass and no thyromegaly present.  Cardiovascular: Normal rate, regular rhythm, S1 normal, S2 normal, normal heart sounds, intact distal pulses and normal pulses.  Exam reveals no gallop and no friction rub.   No murmur heard. Pulses:      Radial pulses are 2+ on the right side, and 2+ on the left side.       Dorsalis pedis pulses are 2+ on the right side, and 2+ on the left side.  Pulmonary/Chest: Effort normal and breath sounds normal. No respiratory distress. He has no wheezes. He has no rhonchi. He has no rales.  Abdominal: Soft. Normal appearance and bowel sounds are normal. He exhibits no distension, no ascites and no mass. There is no hepatosplenomegaly. There is no tenderness. There is no rebound, no guarding and no CVA tenderness.  Well healed surgical  scars  Musculoskeletal: Normal range of motion. He exhibits no edema or tenderness.  Left AKA  Lymphadenopathy:    He has no cervical adenopathy.  Neurological: He is alert and oriented to person, place, and time. He has normal strength. No cranial nerve deficit or sensory deficit.  Skin: Skin is warm, dry and intact. No petechiae and no rash noted. He is not diaphoretic. No erythema. No pallor.  Psychiatric: He has a normal mood and affect. His behavior is normal. Judgment normal.  Nursing note and vitals reviewed.   ED Course  Procedures   DIAGNOSTIC STUDIES: Oxygen Saturation  is 96% on RA, normal by my interpretation.    COORDINATION OF CARE: 11:58 PM Discussed treatment plan which includes Lab work, CT A/P with contrast, Zofran, and IVF with pt at bedside and pt agreed to plan.  Labs Reviewed  LIPASE, BLOOD - Abnormal; Notable for the following:    Lipase 18 (*)    All other components within normal limits  COMPREHENSIVE METABOLIC PANEL - Abnormal; Notable for the following:    Glucose, Bld 100 (*)    Creatinine, Ser 0.54 (*)    Calcium 8.7 (*)    Total Protein 5.6 (*)    Albumin 2.6 (*)    AST 44 (*)    Alkaline Phosphatase 155 (*)    All other components within normal limits  CBC - Abnormal; Notable for the following:    WBC 12.6 (*)    RBC 2.99 (*)    Hemoglobin 9.3 (*)    HCT 29.9 (*)    RDW 21.9 (*)    All other components within normal limits  POC OCCULT BLOOD, ED - Abnormal; Notable for the following:    Fecal Occult Bld POSITIVE (*)    All other components within normal limits  URINALYSIS, ROUTINE W REFLEX MICROSCOPIC (NOT AT [facility])  TYPE AND SCREEN  ABO/RH    I, Everlene Balls, MD, personally reviewed and evaluated these images and lab results as part of my medical decision-making.  Imaging Review Ct Abdomen Pelvis W Contrast  07/18/2015   CLINICAL DATA:  Abdominal pain and cramping. Nausea and vomiting. Elevated white cell count. History of lung cancer and  left hip pleomorphic cell sarcoma.  EXAM: CT ABDOMEN AND PELVIS WITH CONTRAST  TECHNIQUE: Multidetector CT imaging of the abdomen and pelvis was performed using the standard protocol following bolus administration of intravenous contrast.  CONTRAST:  80 mL Omnipaque 300  COMPARISON:  06/19/2014  FINDINGS: New finding of small right pleural effusion with nodular pleural thickening suggesting pleural metastasis. New enlarged lymph nodes in the right cardiophrenic angle consistent with metastasis. Right pulmonary nodules likely also metastatic. Left lung base is clear.  New finding of multiple low-attenuation lesions throughout the liver. Largest is in the inferior liver and measures 9.4 cm in diameter. This is consistent with metastatic disease. Cholelithiasis with contracted gallbladder. No bile duct dilatation. Pancreas, spleen, adrenal glands, abdominal aorta, it and inferior vena cava are unremarkable. Small cysts in the lower pole left kidney is unchanged since previous study. However, there is interval development of sub cm low-attenuation lesions in both kidneys. The interval development of these lesions suggest probable metastatic disease. Largest is in the upper pole right kidney and measures 1 cm diameter. Stomach, small bowel, and colon are not abnormally distended. Mass lesion in the left lower quadrant small bowel, likely distal jejunum. There is apparent cavitation. This measures about 2.5 x 4.5 cm. This is likely small bowel metastasis or possibly lymphoma. Enlarged lymph nodes in the mesenteric consistent with metastases. No free air or free fluid in the abdomen.  Pelvis: Postoperative changes with prior resection of the left hip and left hemipelvis. There is infiltration and soft tissue thickening along the surgical margin at the left sacrum. This is unchanged since previous study and probably represents postoperative scarring. Bladder wall is not thickened. Calcification in the prostate gland without  enlargement. Small amount of free fluid in the pelvis is nonspecific. Appendix is normal. No significant pelvic lymphadenopathy. No destructive bone lesions.  IMPRESSION: Interval development of diffuse metastatic disease. Metastases  demonstrated in the right lung base, right pleura, right cardiophrenic angle, liver, mesenteric, small bowel, and probably in both kidneys. Small right pleural effusion is likely to be malignant.   Electronically Signed   By: Lucienne Capers M.D.   On: 07/18/2015 02:40       MDM   Final diagnoses:  None   Patient presents to the ED for abdominal pain, and vomiting.  He has h/o lung cancer and cancer in his legs.  Will obtain CT scan for evaluation.  His hgb has dropped over 2 units and hemoccult is positive.  Type and screen sent.  Patient will require admission for further evaluation.    Patient now complaining of pain, he was given Dilaudid for control. CT scan shows new diffuse metastatic disease in his right lung base, right pleural, right Connie O phrenic angle, liver, mesentery, small bowel, and likely in both kidneys. This was relayed to the patient, they were upset but demonstrated good understanding. Because patient is also having a GI bleed with these metastases, he'll be transferred to [facility] for further evaluation. I spoke with Dr. Shirleen Schirmer except the patient for transfer.   I personally performed the services described in this documentation, which was scribed in my presence. The recorded information has been reviewed and is accurate.   Everlene Balls, MD 07/18/15 [phone]

## 2015-07-17 NOTE — ED Notes (Signed)
Delay in lab draw pt using bedside commode 

## 2015-07-17 NOTE — ED Notes (Signed)
Per PT[facility] - pt is from home, limit-english speaking (primary lang. Is spanish) pt w/ hx of cancer and began experiencing n/v and abd cramping this evening approx 19:00.

## 2015-07-17 NOTE — ED Notes (Signed)
Bed: WA09 Expected date:  Expected time:  Means of arrival:  Comments: EMS 59 yo male with abdominal pain and vomiting

## 2015-07-18 ENCOUNTER — Encounter (HOSPITAL_COMMUNITY): Payer: Self-pay | Admitting: *Deleted

## 2015-07-18 ENCOUNTER — Emergency Department (HOSPITAL_COMMUNITY): Payer: Medicaid Other

## 2015-07-18 DIAGNOSIS — R1084 Generalized abdominal pain: Secondary | ICD-10-CM | POA: Diagnosis present

## 2015-07-18 DIAGNOSIS — C44509 Unspecified malignant neoplasm of skin of other part of trunk: Secondary | ICD-10-CM | POA: Diagnosis not present

## 2015-07-18 DIAGNOSIS — I10 Essential (primary) hypertension: Secondary | ICD-10-CM | POA: Diagnosis not present

## 2015-07-18 DIAGNOSIS — I2510 Atherosclerotic heart disease of native coronary artery without angina pectoris: Secondary | ICD-10-CM | POA: Diagnosis not present

## 2015-07-18 DIAGNOSIS — Z85828 Personal history of other malignant neoplasm of skin: Secondary | ICD-10-CM | POA: Diagnosis not present

## 2015-07-18 DIAGNOSIS — Z79899 Other long term (current) drug therapy: Secondary | ICD-10-CM | POA: Diagnosis not present

## 2015-07-18 DIAGNOSIS — Z86718 Personal history of other venous thrombosis and embolism: Secondary | ICD-10-CM | POA: Diagnosis not present

## 2015-07-18 DIAGNOSIS — D649 Anemia, unspecified: Secondary | ICD-10-CM | POA: Diagnosis not present

## 2015-07-18 DIAGNOSIS — K922 Gastrointestinal hemorrhage, unspecified: Secondary | ICD-10-CM | POA: Diagnosis not present

## 2015-07-18 LAB — CBC
HCT: 29.9 % — ABNORMAL LOW (ref 39.0–52.0)
Hemoglobin: 9.3 g/dL — ABNORMAL LOW (ref 13.0–17.0)
MCH: 31.1 pg (ref 26.0–34.0)
MCHC: 31.1 g/dL (ref 30.0–36.0)
MCV: 100 fL (ref 78.0–100.0)
PLATELETS: 300 10*3/uL (ref 150–400)
RBC: 2.99 MIL/uL — ABNORMAL LOW (ref 4.22–5.81)
RDW: 21.9 % — AB (ref 11.5–15.5)
WBC: 12.6 10*3/uL — ABNORMAL HIGH (ref 4.0–10.5)

## 2015-07-18 LAB — ABO/RH: ABO/RH(D): O POS

## 2015-07-18 LAB — COMPREHENSIVE METABOLIC PANEL
ALT: 57 U/L (ref 17–63)
AST: 44 U/L — AB (ref 15–41)
Albumin: 2.6 g/dL — ABNORMAL LOW (ref 3.5–5.0)
Alkaline Phosphatase: 155 U/L — ABNORMAL HIGH (ref 38–126)
Anion gap: 9 (ref 5–15)
BUN: 18 mg/dL (ref 6–20)
CO2: 27 mmol/L (ref 22–32)
CREATININE: 0.54 mg/dL — AB (ref 0.61–1.24)
Calcium: 8.7 mg/dL — ABNORMAL LOW (ref 8.9–10.3)
Chloride: 108 mmol/L (ref 101–111)
GFR calc Af Amer: >60 mL/min (ref >60)
GFR calc non Af Amer: >60 mL/min (ref >60)
Glucose, Bld: 100 mg/dL — ABNORMAL HIGH (ref 65–99)
POTASSIUM: 4.2 mmol/L (ref 3.5–5.1)
Sodium: 144 mmol/L (ref 135–145)
Total Bilirubin: 0.5 mg/dL (ref 0.3–1.2)
Total Protein: 5.6 g/dL — ABNORMAL LOW (ref 6.5–8.1)

## 2015-07-18 LAB — TYPE AND SCREEN
ABO/RH(D): O POS
ANTIBODY SCREEN: NEGATIVE

## 2015-07-18 LAB — POC OCCULT BLOOD, ED: Fecal Occult Bld: POSITIVE — AB

## 2015-07-18 LAB — LIPASE, BLOOD: Lipase: 18 U/L — ABNORMAL LOW (ref 22–51)

## 2015-07-18 MED ORDER — IOHEXOL 300 MG/ML  SOLN
80.0000 mL | Freq: Once | INTRAMUSCULAR | Status: AC | PRN
  Administered 2015-07-18: 80 mL via INTRAVENOUS

## 2015-07-18 MED ORDER — HYDROMORPHONE HCL 1 MG/ML IJ SOLN
1.0000 mg | Freq: Once | INTRAMUSCULAR | Status: AC
  Administered 2015-07-18: 1 mg via INTRAVENOUS
  Filled 2015-07-18: qty 1

## 2015-07-18 MED ORDER — IOHEXOL 300 MG/ML  SOLN
50.0000 mL | Freq: Once | INTRAMUSCULAR | Status: AC | PRN
  Administered 2015-07-18: 50 mL via ORAL

## 2015-07-31 ENCOUNTER — Emergency Department (HOSPITAL_COMMUNITY): Payer: Medicaid Other

## 2015-07-31 ENCOUNTER — Emergency Department (HOSPITAL_COMMUNITY)
Admission: EM | Admit: 2015-07-31 | Discharge: 2015-07-31 | Disposition: A | Discharge status: Other Acute Inpatient Hospital | Payer: Medicaid Other | Attending: Emergency Medicine | Admitting: Emergency Medicine

## 2015-07-31 ENCOUNTER — Encounter (HOSPITAL_COMMUNITY): Payer: Self-pay | Admitting: *Deleted

## 2015-07-31 DIAGNOSIS — Z86718 Personal history of other venous thrombosis and embolism: Secondary | ICD-10-CM | POA: Diagnosis not present

## 2015-07-31 DIAGNOSIS — I10 Essential (primary) hypertension: Secondary | ICD-10-CM | POA: Diagnosis not present

## 2015-07-31 DIAGNOSIS — Z79899 Other long term (current) drug therapy: Secondary | ICD-10-CM | POA: Insufficient documentation

## 2015-07-31 DIAGNOSIS — I2510 Atherosclerotic heart disease of native coronary artery without angina pectoris: Secondary | ICD-10-CM | POA: Insufficient documentation

## 2015-07-31 DIAGNOSIS — Z7951 Long term (current) use of inhaled steroids: Secondary | ICD-10-CM | POA: Insufficient documentation

## 2015-07-31 DIAGNOSIS — J189 Pneumonia, unspecified organism: Secondary | ICD-10-CM

## 2015-07-31 DIAGNOSIS — R109 Unspecified abdominal pain: Secondary | ICD-10-CM | POA: Diagnosis present

## 2015-07-31 DIAGNOSIS — Z85830 Personal history of malignant neoplasm of bone: Secondary | ICD-10-CM | POA: Insufficient documentation

## 2015-07-31 DIAGNOSIS — Z8719 Personal history of other diseases of the digestive system: Secondary | ICD-10-CM | POA: Diagnosis not present

## 2015-07-31 DIAGNOSIS — J159 Unspecified bacterial pneumonia: Secondary | ICD-10-CM | POA: Diagnosis not present

## 2015-07-31 DIAGNOSIS — Z859 Personal history of malignant neoplasm, unspecified: Secondary | ICD-10-CM | POA: Diagnosis not present

## 2015-07-31 LAB — CBC WITH DIFFERENTIAL/PLATELET
Basophils Absolute: 0 10*3/uL (ref 0.0–0.1)
Basophils Relative: 0 %
Eosinophils Absolute: 0 10*3/uL (ref 0.0–0.7)
Eosinophils Relative: 0 %
HCT: 29.1 % — ABNORMAL LOW (ref 39.0–52.0)
HEMOGLOBIN: 9.3 g/dL — AB (ref 13.0–17.0)
LYMPHS ABS: 0.7 10*3/uL (ref 0.7–4.0)
LYMPHS PCT: 6 %
MCH: 31.1 pg (ref 26.0–34.0)
MCHC: 32 g/dL (ref 30.0–36.0)
MCV: 97.3 fL (ref 78.0–100.0)
Monocytes Absolute: 0.4 10*3/uL (ref 0.1–1.0)
Monocytes Relative: 3 %
NEUTROS PCT: 91 %
Neutro Abs: 11.3 10*3/uL — ABNORMAL HIGH (ref 1.7–7.7)
Platelets: 215 10*3/uL (ref 150–400)
RBC: 2.99 MIL/uL — AB (ref 4.22–5.81)
RDW: 21 % — ABNORMAL HIGH (ref 11.5–15.5)
WBC: 12.3 10*3/uL — AB (ref 4.0–10.5)

## 2015-07-31 LAB — COMPREHENSIVE METABOLIC PANEL
ALK PHOS: 234 U/L — AB (ref 38–126)
ALT: 45 U/L (ref 17–63)
AST: 45 U/L — ABNORMAL HIGH (ref 15–41)
Albumin: 2.3 g/dL — ABNORMAL LOW (ref 3.5–5.0)
Anion gap: 8 (ref 5–15)
BUN: 12 mg/dL (ref 6–20)
CALCIUM: 7.6 mg/dL — AB (ref 8.9–10.3)
CO2: 27 mmol/L (ref 22–32)
CREATININE: 0.52 mg/dL — AB (ref 0.61–1.24)
Chloride: 102 mmol/L (ref 101–111)
GFR calc Af Amer: >60 mL/min (ref >60)
Glucose, Bld: 91 mg/dL (ref 65–99)
Potassium: 3.3 mmol/L — ABNORMAL LOW (ref 3.5–5.1)
SODIUM: 137 mmol/L (ref 135–145)
Total Bilirubin: 1.1 mg/dL (ref 0.3–1.2)
Total Protein: 5 g/dL — ABNORMAL LOW (ref 6.5–8.1)

## 2015-07-31 MED ORDER — PIPERACILLIN-TAZOBACTAM 3.375 G IVPB
3.3750 g | Freq: Once | INTRAVENOUS | Status: AC
  Administered 2015-07-31: 3.375 g via INTRAVENOUS
  Filled 2015-07-31: qty 50

## 2015-07-31 NOTE — ED Notes (Signed)
Awake. Verbally responsive. A/O x4. Resp even and unlabored. No audible adventitious breath sounds noted. ABC's intact. Family at bedside. 

## 2015-07-31 NOTE — ED Notes (Signed)
Bed: QN99 Expected date:  Expected time:  Means of arrival:  Comments: [place] d

## 2015-07-31 NOTE — ED Notes (Signed)
Patient taken to [facility] by [facility].

## 2015-07-31 NOTE — ED Provider Notes (Signed)
CSN: [id number]     Arrival date & time 07/31/15  1309 History   First MD Initiated Contact with Patient 07/31/15 1509     Chief Complaint  Patient presents with  . 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 Patient not aware. This information was found in chart. He has chronic abdominal pain but today is more intense than usual. Patient is receiving chemotherapy via port a cath and last treatment was 2 weeks ago. Patient also complains of productive cough and intermittent shortness of breath. Patient complains of constant nausea. He is a left aka and ambulates with crutches. Daughter n law at the bedside and does not speak any [place]. Past Medical History  Diagnosis Date  . Hypertension   . Cancer     Dr. Kendall Flack at [facility] is H/O; BONE CA-- CHEMO; pleomorphic sarcoma left leg/hip  . Coronary atherosclerosis     per CT 07/13/13 at [facility]  . Cholelithiases     per CT 07/13/13  . Fatty liver     per CT 07/13/13 at [facility]  . Pulmonary nodules     per CT 07/13/13 at [facility]; multiple b/l   . DVT (deep venous thrombosis)     dx 9/18 right IJ and R SCV on Xarelto  . Pleomorphic cell sarcoma    Past Surgical History  Procedure Laterality Date  . Other surgical history      renal stone  . Other surgical history      FNA  . Other surgical history      Bx of left pelvic mass iliac bone   . Leg amputation Left    Family History  Problem Relation Age of Onset  . Cancer Mother   . Diabetes Sister   . Other      grandaughter with immature teratoma   Social History  Substance Use Topics  . Smoking status: Never Smoker   . Smokeless tobacco: Never Used  . Alcohol Use: No    Review of Systems  Unable to perform ROS: Other      Allergies  Pork-derived products; Ativan; and Heparin (porcine)  Home Medications   Prior to Admission medications   Medication Sig Start Date End Date Taking? Authorizing  Provider  albuterol (PROVENTIL HFA;VENTOLIN HFA) 108 (90 BASE) MCG/ACT inhaler Inhale 1-2 puffs into the lungs every 6 (six) hours as needed for wheezing or shortness of breath. 02/17/15  Yes April Palumbo, MD  benzonatate (TESSALON) 100 MG capsule Take 100 mg by mouth 3 (three) times daily as needed for cough.   Yes Historical Provider, MD  citalopram (CELEXA) 20 MG tablet Take 20 mg by mouth daily.   Yes Historical Provider, MD  dexamethasone (DECADRON) 2 MG tablet tome ONE tableta (2 mg) BY MOUTH EVERY DAY por 7 DAYS THEN 1/2 (1mg ) tableta EVERY DAY por 7 DAYS 07/28/15  Yes Historical Provider, MD  dexamethasone (DECADRON) 4 MG tablet Take 4 mg by mouth daily.   Yes Historical Provider, MD  furosemide (LASIX) 20 MG tablet Take 20 mg by mouth every other day.   Yes Historical Provider, MD  OxyCODONE HCl ER 30 MG T12A Take 1 tablet by mouth every 12 (twelve) hours.   Yes Historical Provider, MD  pantoprazole (PROTONIX) 40 MG tablet Take 40 mg by mouth daily.   Yes Historical Provider, MD  pazopanib (VOTRIENT) 200 MG tablet Take 800 mg by  mouth daily. Take on an empty stomach.   Yes Historical Provider, MD  pravastatin (PRAVACHOL) 40 MG tablet Take 40 mg by mouth daily.  01/24/15  Yes Historical Provider, MD  prochlorperazine (COMPAZINE) 10 MG tablet Take 10 mg by mouth every 6 (six) hours as needed for nausea or vomiting.   Yes Historical Provider, MD  ranitidine (ZANTAC) 150 MG tablet Take 150 mg by mouth 2 (two) times daily.   Yes Historical Provider, MD  scopolamine (TRANSDERM-SCOP, 1.5 MG,) 1 MG/3DAYS Place 1 patch onto the skin every 3 (three) days. 07/20/15  Yes Historical Provider, MD  sennosides-docusate sodium (SENOKOT-S) 8.6-50 MG tablet Take 1 tablet by mouth daily.   Yes Historical Provider, MD  acetaminophen (TYLENOL) 650 MG CR tablet Take 650 mg by mouth every 4 (four) hours as needed for pain.    Historical Provider, MD  doxycycline (VIBRAMYCIN) 100 MG capsule Take 1 capsule (100 mg total)  by mouth 2 (two) times daily. One po bid x 7 days Patient not taking: Reported on 07/18/2015 02/17/15   April Palumbo, MD  ibuprofen (ADVIL,MOTRIN) 600 MG tablet Take 1 tablet (600 mg total) by mouth every 6 (six) hours as needed. Patient not taking: Reported on 01/25/2015 06/20/14   Hoyle Sauer, MD  loratadine (CLARITIN) 10 MG tablet Take 1 tablet (10 mg total) by mouth daily. Patient not taking: Reported on 01/25/2015 1/74/08   Delora Fuel, MD  oxyCODONE-acetaminophen (PERCOCET) 10-325 MG per tablet Take 1 tablet by mouth every 4 (four) hours as needed for pain. Patient not taking: Reported on 02/15/2015 01/25/15   Alfonzo Beers, MD  predniSONE (DELTASONE) 50 MG tablet Take 1 tablet (50 mg total) by mouth daily. 2 tabs po daily x 3 days Patient not taking: Reported on 01/25/2015 1/44/81   Delora Fuel, MD   BP 856/31 mmHg  Pulse 97  Temp(Src) 97.7 F (36.5 C) (Oral)  Resp 19  SpO2 97% Physical Exam  Constitutional: He appears well-developed and well-nourished. No distress.  HENT:  Head: Normocephalic and atraumatic.  Eyes: Pupils are equal, round, and reactive to light.  Neck: Normal range of motion.  Cardiovascular: Normal rate and intact distal pulses.   Pulmonary/Chest: No respiratory distress. He has decreased breath sounds in the right middle field and the right lower field.  Abdominal: Normal appearance. He exhibits no distension.  Musculoskeletal: Normal range of motion.  Neurological: He is alert. No cranial nerve deficit.  Skin: Skin is warm and dry. No rash noted.  Psychiatric: He has a normal mood and affect. His behavior is normal.  Nursing note and vitals reviewed.   ED Course  Procedures (including critical care time) Medications  piperacillin-tazobactam (ZOSYN) IVPB 3.375 g (0 g Intravenous Stopped 07/31/15 1912)    Labs Review Labs Reviewed  CBC WITH DIFFERENTIAL/PLATELET - Abnormal; Notable for the following:    WBC 12.3 (*)    RBC 2.99 (*)    Hemoglobin 9.3 (*)     HCT 29.1 (*)    RDW 21.0 (*)    Neutro Abs 11.3 (*)    All other components within normal limits  COMPREHENSIVE METABOLIC PANEL - Abnormal; Notable for the following:    Potassium 3.3 (*)    Creatinine, Ser 0.52 (*)    Calcium 7.6 (*)    Total Protein 5.0 (*)    Albumin 2.3 (*)    AST 45 (*)    Alkaline Phosphatase 234 (*)    All other components within normal limits  CULTURE, BLOOD (  ROUTINE X 2)  CULTURE, BLOOD (ROUTINE X 2)    Imaging Review DG Chest 2 View (Final result) Result time: 07/31/15 16:17:23   Final result by Rad Results In Interface (07/31/15 16:17:23)   Narrative:   CLINICAL DATA: History of stomach cancer with mets to kidneys and lung. Chronic abdominal pain, more intense than usual. Chemotherapy, last treatment 2 weeks ago. Productive cough. Shortness of breath.  EXAM: CHEST 2 VIEW  COMPARISON: 02/17/2015  FINDINGS: Right-sided power port tip to the lower superior vena cava. Heart size is normal. There is increased opacity in the right lung consisting of pleural effusion and right lower lobe consolidation. Small amount of right upper lobe infiltrate or atelectasis is also noted. There is increased size of mass within the superior segment of the left lower lobe, now measuring approximately 1.8 cm. Persistent right upper lobe mass the upper is probably stable.  IMPRESSION: 1. Increased right upper lobe and right lower lobe infiltrates and pleural effusion. 2. Increased size of mass within the superior segment left lower lobe.   Electronically Signed By: Nolon Nations M.D. On: 07/31/2015 16:17      MDM   Final diagnoses:  Healthcare-associated pneumonia        Leonard Schwartz, MD 08/03/15 [phone]

## 2015-07-31 NOTE — ED Notes (Addendum)
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 Patient not aware. This information was found in chart. He has chronic abdominal pain but today is more intense than usual. Patient is receiving chemotherapy via port a cath and last treatment was 2 weeks ago. Patient also complains of productive cough and intermittent shortness of breath. Patient complains of constant nausea. He is a left aka and ambulates with crutches. Daughter n law at the bedside and does not speak any [place].

## 2015-07-31 NOTE — ED Notes (Signed)
Bed: [facility] Expected date:  Expected time:  Means of arrival:  Comments: EMS- 59yo M, abdominal pain/Hx of stomach CA

## 2015-07-31 NOTE — ED Notes (Signed)
Awake. Verbally responsive. Resp even and unlabored. No audible adventitious breath sounds noted. ABC's intact. Abd soft/nondistended but tender to palpate. BS (+) and active x4 quadrants. Pt reported nausea without emesis/diarrhea. 

## 2015-07-31 NOTE — ED Notes (Signed)
Awake. Verbally responsive. Resp even and unlabored. No audible adventitious breath sounds noted. ABC's intact. Abd soft/nondistended but tender to palpate. BS (+) and active x4 quadrants. No N/V/D reported. 

## 2015-07-31 NOTE — ED Notes (Signed)
Awake. Verbally responsive. A/O x4. Resp even and unlabored. No audible adventitious breath sounds noted. ABC's intact. IV saline lock patent and intact. Family at bedside. 

## 2015-08-05 LAB — CULTURE, BLOOD (ROUTINE X 2)
CULTURE: NO GROWTH
Culture: NO GROWTH

## 2015-08-08 ENCOUNTER — Emergency Department (HOSPITAL_COMMUNITY): Payer: Medicaid Other

## 2015-08-08 ENCOUNTER — Encounter (HOSPITAL_COMMUNITY): Payer: Self-pay | Admitting: *Deleted

## 2015-08-08 ENCOUNTER — Inpatient Hospital Stay (HOSPITAL_COMMUNITY)
Admission: EM | Admit: 2015-08-08 | Discharge: 2015-08-12 | DRG: 683 | Disposition: E | Payer: Medicaid Other | Attending: Internal Medicine | Admitting: Internal Medicine

## 2015-08-08 DIAGNOSIS — C7800 Secondary malignant neoplasm of unspecified lung: Secondary | ICD-10-CM | POA: Diagnosis present

## 2015-08-08 DIAGNOSIS — A419 Sepsis, unspecified organism: Secondary | ICD-10-CM | POA: Diagnosis present

## 2015-08-08 DIAGNOSIS — R6520 Severe sepsis without septic shock: Secondary | ICD-10-CM

## 2015-08-08 DIAGNOSIS — C7989 Secondary malignant neoplasm of other specified sites: Secondary | ICD-10-CM

## 2015-08-08 DIAGNOSIS — Z85830 Personal history of malignant neoplasm of bone: Secondary | ICD-10-CM | POA: Diagnosis not present

## 2015-08-08 DIAGNOSIS — E860 Dehydration: Secondary | ICD-10-CM | POA: Diagnosis present

## 2015-08-08 DIAGNOSIS — Z809 Family history of malignant neoplasm, unspecified: Secondary | ICD-10-CM

## 2015-08-08 DIAGNOSIS — Z833 Family history of diabetes mellitus: Secondary | ICD-10-CM

## 2015-08-08 DIAGNOSIS — Z87442 Personal history of urinary calculi: Secondary | ICD-10-CM | POA: Diagnosis not present

## 2015-08-08 DIAGNOSIS — R531 Weakness: Secondary | ICD-10-CM | POA: Diagnosis not present

## 2015-08-08 DIAGNOSIS — R0602 Shortness of breath: Secondary | ICD-10-CM | POA: Diagnosis not present

## 2015-08-08 DIAGNOSIS — Z7189 Other specified counseling: Secondary | ICD-10-CM

## 2015-08-08 DIAGNOSIS — E871 Hypo-osmolality and hyponatremia: Secondary | ICD-10-CM | POA: Diagnosis present

## 2015-08-08 DIAGNOSIS — N179 Acute kidney failure, unspecified: Principal | ICD-10-CM | POA: Diagnosis present

## 2015-08-08 DIAGNOSIS — C787 Secondary malignant neoplasm of liver and intrahepatic bile duct: Secondary | ICD-10-CM | POA: Diagnosis present

## 2015-08-08 DIAGNOSIS — Z79899 Other long term (current) drug therapy: Secondary | ICD-10-CM

## 2015-08-08 DIAGNOSIS — R740 Nonspecific elevation of levels of transaminase and lactic acid dehydrogenase [LDH]: Secondary | ICD-10-CM | POA: Diagnosis present

## 2015-08-08 DIAGNOSIS — Z515 Encounter for palliative care: Secondary | ICD-10-CM

## 2015-08-08 DIAGNOSIS — Z66 Do not resuscitate: Secondary | ICD-10-CM | POA: Diagnosis present

## 2015-08-08 DIAGNOSIS — G8929 Other chronic pain: Secondary | ICD-10-CM | POA: Diagnosis present

## 2015-08-08 DIAGNOSIS — E872 Acidosis, unspecified: Secondary | ICD-10-CM

## 2015-08-08 DIAGNOSIS — E46 Unspecified protein-calorie malnutrition: Secondary | ICD-10-CM | POA: Diagnosis present

## 2015-08-08 DIAGNOSIS — Z888 Allergy status to other drugs, medicaments and biological substances status: Secondary | ICD-10-CM | POA: Diagnosis not present

## 2015-08-08 DIAGNOSIS — Z7952 Long term (current) use of systemic steroids: Secondary | ICD-10-CM

## 2015-08-08 DIAGNOSIS — C169 Malignant neoplasm of stomach, unspecified: Secondary | ICD-10-CM | POA: Diagnosis present

## 2015-08-08 DIAGNOSIS — D6959 Other secondary thrombocytopenia: Secondary | ICD-10-CM | POA: Diagnosis present

## 2015-08-08 DIAGNOSIS — Z8701 Personal history of pneumonia (recurrent): Secondary | ICD-10-CM | POA: Diagnosis not present

## 2015-08-08 DIAGNOSIS — E875 Hyperkalemia: Secondary | ICD-10-CM | POA: Diagnosis present

## 2015-08-08 DIAGNOSIS — I10 Essential (primary) hypertension: Secondary | ICD-10-CM | POA: Diagnosis not present

## 2015-08-08 DIAGNOSIS — C499 Malignant neoplasm of connective and soft tissue, unspecified: Secondary | ICD-10-CM | POA: Diagnosis not present

## 2015-08-08 DIAGNOSIS — I2510 Atherosclerotic heart disease of native coronary artery without angina pectoris: Secondary | ICD-10-CM | POA: Diagnosis present

## 2015-08-08 DIAGNOSIS — J189 Pneumonia, unspecified organism: Secondary | ICD-10-CM

## 2015-08-08 DIAGNOSIS — K760 Fatty (change of) liver, not elsewhere classified: Secondary | ICD-10-CM | POA: Diagnosis present

## 2015-08-08 DIAGNOSIS — G893 Neoplasm related pain (acute) (chronic): Secondary | ICD-10-CM | POA: Diagnosis not present

## 2015-08-08 LAB — COMPREHENSIVE METABOLIC PANEL
ALBUMIN: 2 g/dL — AB (ref 3.5–5.0)
ALT: 32 U/L (ref 17–63)
ANION GAP: 16 — AB (ref 5–15)
AST: 98 U/L — ABNORMAL HIGH (ref 15–41)
Alkaline Phosphatase: 209 U/L — ABNORMAL HIGH (ref 38–126)
BUN: 47 mg/dL — ABNORMAL HIGH (ref 6–20)
CO2: 14 mmol/L — AB (ref 22–32)
Calcium: 7.3 mg/dL — ABNORMAL LOW (ref 8.9–10.3)
Chloride: 100 mmol/L — ABNORMAL LOW (ref 101–111)
Creatinine, Ser: 1.45 mg/dL — ABNORMAL HIGH (ref 0.61–1.24)
GFR calc Af Amer: 59 mL/min — ABNORMAL LOW (ref >60)
GFR calc non Af Amer: 51 mL/min — ABNORMAL LOW (ref >60)
GLUCOSE: 121 mg/dL — AB (ref 65–99)
POTASSIUM: 5.6 mmol/L — AB (ref 3.5–5.1)
SODIUM: 130 mmol/L — AB (ref 135–145)
Total Bilirubin: 3 mg/dL — ABNORMAL HIGH (ref 0.3–1.2)
Total Protein: 4.7 g/dL — ABNORMAL LOW (ref 6.5–8.1)

## 2015-08-08 LAB — I-STAT CG4 LACTIC ACID, ED: Lactic Acid, Venous: 7.35 mmol/L (ref 0.5–2.0)

## 2015-08-08 LAB — CBC WITH DIFFERENTIAL/PLATELET
BASOS ABS: 0 10*3/uL (ref 0.0–0.1)
Basophils Relative: 0 %
EOS PCT: 0 %
Eosinophils Absolute: 0 10*3/uL (ref 0.0–0.7)
HCT: 35.6 % — ABNORMAL LOW (ref 39.0–52.0)
Hemoglobin: 11.5 g/dL — ABNORMAL LOW (ref 13.0–17.0)
Lymphocytes Relative: 5 %
Lymphs Abs: 0.6 10*3/uL — ABNORMAL LOW (ref 0.7–4.0)
MCH: 30.7 pg (ref 26.0–34.0)
MCHC: 32.3 g/dL (ref 30.0–36.0)
MCV: 94.9 fL (ref 78.0–100.0)
MONO ABS: 0.6 10*3/uL (ref 0.1–1.0)
MONOS PCT: 5 %
NEUTROS PCT: 90 %
Neutro Abs: 10.1 10*3/uL — ABNORMAL HIGH (ref 1.7–7.7)
PLATELETS: 80 10*3/uL — AB (ref 150–400)
RBC: 3.75 MIL/uL — AB (ref 4.22–5.81)
RDW: 19.5 % — ABNORMAL HIGH (ref 11.5–15.5)
WBC: 11.3 10*3/uL — AB (ref 4.0–10.5)

## 2015-08-08 MED ORDER — ONDANSETRON HCL 4 MG PO TABS: 4.0000 mg | ORAL_TABLET | Freq: Four times a day (QID) | ORAL | Status: DC | PRN

## 2015-08-08 MED ORDER — MORPHINE SULFATE (PF) 4 MG/ML IV SOLN
4.0000 mg | Freq: Once | INTRAVENOUS | Status: AC
  Administered 2015-08-08: 4 mg via INTRAVENOUS
  Filled 2015-08-08: qty 1

## 2015-08-08 MED ORDER — DEXTROSE-NACL 5-0.9 % IV SOLN
INTRAVENOUS | Status: DC
Start: 2015-08-08 — End: 2015-08-09
  Administered 2015-08-08: 23:00:00 via INTRAVENOUS

## 2015-08-08 MED ORDER — MORPHINE SULFATE (PF) 2 MG/ML IV SOLN: 2.0000 mg | INTRAVENOUS | Status: DC | PRN

## 2015-08-08 MED ORDER — ONDANSETRON HCL 4 MG/2ML IJ SOLN: 4.0000 mg | Freq: Four times a day (QID) | INTRAMUSCULAR | Status: DC | PRN

## 2015-08-08 MED ORDER — ACETAMINOPHEN 650 MG RE SUPP: 650.0000 mg | Freq: Four times a day (QID) | RECTAL | Status: DC | PRN

## 2015-08-08 MED ORDER — VITAMIN B-1 100 MG PO TABS
100.0000 mg | ORAL_TABLET | Freq: Every day | ORAL | Status: DC
  Filled 2015-08-08: qty 1

## 2015-08-08 MED ORDER — OXYCODONE HCL 5 MG PO TABS: 5.0000 mg | ORAL_TABLET | ORAL | Status: DC | PRN

## 2015-08-08 MED ORDER — FOLIC ACID 1 MG PO TABS
1.0000 mg | ORAL_TABLET | Freq: Every day | ORAL | Status: DC
  Filled 2015-08-08: qty 1

## 2015-08-08 MED ORDER — CITALOPRAM HYDROBROMIDE 20 MG PO TABS
20.0000 mg | ORAL_TABLET | Freq: Every day | ORAL | Status: DC
  Filled 2015-08-08: qty 1

## 2015-08-08 MED ORDER — ACETAMINOPHEN 325 MG PO TABS: 650.0000 mg | ORAL_TABLET | Freq: Four times a day (QID) | ORAL | Status: DC | PRN

## 2015-08-08 MED ORDER — BENZONATATE 100 MG PO CAPS: 100.0000 mg | ORAL_CAPSULE | Freq: Three times a day (TID) | ORAL | Status: DC | PRN

## 2015-08-08 MED ORDER — SODIUM CHLORIDE 0.9 % IV BOLUS (SEPSIS)
1000.0000 mL | Freq: Once | INTRAVENOUS | Status: AC
  Administered 2015-08-08: 1000 mL via INTRAVENOUS

## 2015-08-08 MED ORDER — ALBUTEROL SULFATE HFA 108 (90 BASE) MCG/ACT IN AERS: 1.0000 | INHALATION_SPRAY | Freq: Four times a day (QID) | RESPIRATORY_TRACT | Status: DC | PRN

## 2015-08-08 MED ORDER — POLYETHYLENE GLYCOL 3350 17 G PO PACK: 17.0000 g | PACK | Freq: Every day | ORAL | Status: DC | PRN

## 2015-08-08 MED ORDER — FAMOTIDINE 20 MG PO TABS
20.0000 mg | ORAL_TABLET | Freq: Every day | ORAL | Status: DC
  Filled 2015-08-08: qty 1

## 2015-08-08 MED ORDER — ALBUTEROL SULFATE (2.5 MG/3ML) 0.083% IN NEBU: 2.5000 mg | INHALATION_SOLUTION | Freq: Four times a day (QID) | RESPIRATORY_TRACT | Status: DC | PRN

## 2015-08-08 MED ORDER — ENSURE ENLIVE PO LIQD: 237.0000 mL | Freq: Two times a day (BID) | ORAL | Status: DC

## 2015-08-08 MED ORDER — PANTOPRAZOLE SODIUM 40 MG PO TBEC
40.0000 mg | DELAYED_RELEASE_TABLET | Freq: Every day | ORAL | Status: DC
  Filled 2015-08-08: qty 1

## 2015-08-08 MED ORDER — PAZOPANIB HCL 200 MG PO TABS: 800.0000 mg | ORAL_TABLET | Freq: Every day | ORAL | Status: DC

## 2015-08-08 MED ORDER — SCOPOLAMINE 1 MG/3DAYS TD PT72
1.0000 | MEDICATED_PATCH | TRANSDERMAL | Status: DC
  Administered 2015-08-08: 1.5 mg via TRANSDERMAL
  Filled 2015-08-08: qty 1

## 2015-08-08 MED ORDER — ADULT MULTIVITAMIN W/MINERALS CH
1.0000 | ORAL_TABLET | Freq: Every day | ORAL | Status: DC
  Filled 2015-08-08: qty 1

## 2015-08-08 MED ORDER — PRAVASTATIN SODIUM 40 MG PO TABS
40.0000 mg | ORAL_TABLET | Freq: Every day | ORAL | Status: DC
  Filled 2015-08-08: qty 1

## 2015-08-08 MED ORDER — DEXAMETHASONE 0.5 MG PO TABS
1.0000 mg | ORAL_TABLET | Freq: Every day | ORAL | Status: DC
  Filled 2015-08-08: qty 2

## 2015-08-08 NOTE — H&P (Signed)
[facility] History and Physical  Jakyle Petrucelli OFB:[id number] DOB: 07/11/1956 DOA: 08/02/2015  Referring physician: Leo Grosser, MD PCP: PROVIDER NOT IN SYSTEM   Chief Complaint: Shortness of breath  HPI: Gregory Frazier is a 59 y.o. male with history of HTN metastatic sarcoma presents to the ED with increased weakness. Patient does not speak english. He was seen in [place] on September 16 in the sarcoma clinic with history of pain in the hips. Patient had been found to have large lytic lesions in the left hip. Patient had also noted to have increased weakness. Patient was found to have lesions involving the lung which in May was solitary and when he returned from [place] in July he now had multiple metastatic lesions. Patient was started on chemo therapy in 2015 at [facility] with 9 cycles through Feb 2016. Patient still however had disease progression noted. Currently he has extensive involvement of the abdomen and chest. Patient was recently treated for sepsis. Patient now presents with increased weakness. At this time he wants hospice care and does not want to proceed with reassessment at [facility]. Dr Ervin Knack spoke to the son directly in [place] and received this information regarding code status and also the request to . I also spoke to the grand daughter and she confirmed that they do not want any aggressive measures. They would like to keep him comfortable and would like hospice consultation.   Review of Systems:  Complete ROS attempted hwoever not able to provide due to somnolence and lethargy  Past Medical History  Diagnosis Date  . Hypertension   . Cancer     Dr. Kendall Flack at [facility] is H/O; BONE CA-- CHEMO; pleomorphic sarcoma left leg/hip  . Coronary atherosclerosis     per CT 07/13/13 at [facility]  . Cholelithiases     per CT 07/13/13  . Fatty liver     per CT 07/13/13 at [facility]  . Pulmonary nodules     per CT 07/13/13 at [facility]; multiple b/l   . DVT (deep venous thrombosis)    dx 9/18 right IJ and R SCV on Xarelto  . Pleomorphic cell sarcoma    Past Surgical History  Procedure Laterality Date  . Other surgical history      renal stone  . Other surgical history      FNA  . Other surgical history      Bx of left pelvic mass iliac bone   . Leg amputation Left    Social History:  reports that he has never smoked. He has never used smokeless tobacco. He reports that he does not drink alcohol or use illicit drugs.  Allergies  Allergen Reactions  . Pork-Derived Products Other (See Comments)    Not an allergy - religious belief to not consume anything pork-derived **Patient and family state it is okay to have medications that alert [place] with the "pork-derived" warning**   . Ativan [Lorazepam] Other (See Comments)    Hallucinations, agitation   . Heparin (Porcine) Other (See Comments)    Patient does not consume pork products per religious practice.    Family History  Problem Relation Age of Onset  . Cancer Mother   . Diabetes Sister   . Other      grandaughter with immature teratoma     Prior to Admission medications   Medication Sig Start Date End Date Taking? Authorizing Provider  acetaminophen (TYLENOL) 650 MG CR tablet Take 650 mg by mouth every 4 (four) hours as needed for pain.  Yes Historical Provider, MD  albuterol (PROVENTIL HFA;VENTOLIN HFA) 108 (90 BASE) MCG/ACT inhaler Inhale 1-2 puffs into the lungs every 6 (six) hours as needed for wheezing or shortness of breath. 02/17/15  Yes April Palumbo, MD  benzonatate (TESSALON) 100 MG capsule Take 100 mg by mouth 3 (three) times daily as needed for cough.   Yes Historical Provider, MD  citalopram (CELEXA) 20 MG tablet Take 20 mg by mouth daily.   Yes Historical Provider, MD  dexamethasone (DECADRON) 1 MG tablet Take 1 mg by mouth daily. For 4 days, starting 9/25.   Yes Historical Provider, MD  furosemide (LASIX) 20 MG tablet Take 20 mg by mouth every other day.   Yes Historical Provider, MD    levofloxacin (LEVAQUIN) 500 MG tablet Take 1 tablet by mouth daily. For 1 day. 08/07/15  Yes Historical Provider, MD  ondansetron (ZOFRAN-ODT) 8 MG disintegrating tablet Take 8 mg by mouth every 8 (eight) hours as needed for nausea or vomiting.   Yes Historical Provider, MD  OxyCODONE HCl ER 30 MG T12A Take 1 tablet by mouth every 12 (twelve) hours.   Yes Historical Provider, MD  pantoprazole (PROTONIX) 40 MG tablet Take 40 mg by mouth daily.   Yes Historical Provider, MD  pazopanib (VOTRIENT) 200 MG tablet Take 800 mg by mouth daily. Take on an empty stomach.   Yes Historical Provider, MD  pravastatin (PRAVACHOL) 40 MG tablet Take 40 mg by mouth daily.  01/24/15  Yes Historical Provider, MD  prochlorperazine (COMPAZINE) 10 MG tablet Take 10 mg by mouth every 6 (six) hours as needed for nausea or vomiting.   Yes Historical Provider, MD  ranitidine (ZANTAC) 150 MG tablet Take 150 mg by mouth 2 (two) times daily.   Yes Historical Provider, MD  scopolamine (TRANSDERM-SCOP, 1.5 MG,) 1 MG/3DAYS Place 1 patch onto the skin every 3 (three) days. 07/20/15  Yes Historical Provider, MD  sennosides-docusate sodium (SENOKOT-S) 8.6-50 MG tablet Take 1 tablet by mouth daily.   Yes Historical Provider, MD  doxycycline (VIBRAMYCIN) 100 MG capsule Take 1 capsule (100 mg total) by mouth 2 (two) times daily. One po bid x 7 days Patient not taking: Reported on 07/18/2015 02/17/15   April Palumbo, MD  ibuprofen (ADVIL,MOTRIN) 600 MG tablet Take 1 tablet (600 mg total) by mouth every 6 (six) hours as needed. Patient not taking: Reported on 01/25/2015 06/20/14   Hoyle Sauer, MD  loratadine (CLARITIN) 10 MG tablet Take 1 tablet (10 mg total) by mouth daily. Patient not taking: Reported on 01/25/2015 5/32/99   Delora Fuel, MD  oxyCODONE-acetaminophen (PERCOCET) 10-325 MG per tablet Take 1 tablet by mouth every 4 (four) hours as needed for pain. Patient not taking: Reported on 02/15/2015 01/25/15   Alfonzo Beers, MD  predniSONE  (DELTASONE) 50 MG tablet Take 1 tablet (50 mg total) by mouth daily. 2 tabs po daily x 3 days Patient not taking: Reported on 01/25/2015 2/42/68   Delora Fuel, MD   Physical Exam: Filed Vitals:   07/16/2015 1918 08/07/2015 1920 07/29/2015 1930 08/01/2015 2000  BP:  120/70 125/73 135/73  Pulse: 121 120 115 105  Temp:      TempSrc:      Resp: 8 10 8 8   SpO2: 97%  97% 96%    Wt Readings from Last 3 Encounters:  02/17/15 49.442 kg (109 lb)  02/15/15 50.349 kg (111 lb)  06/22/14 68.04 kg (150 lb)    General:  Appears somnolent lethargic Eyes: normal lids, irises &  conjunctiva ENT: unable to assess Neck: no thyromegaly Cardiovascular: RRR, no m/r Respiratory: CTA bilaterally, Normal respiratory effort. Abdomen: soft, ntnd Skin: no rash or induration Musculoskeletal: unable to assess +Left LE amputation Psychiatric: unable to assess Neurologic: grossly non-focal.          Labs on Admission:  Basic Metabolic Panel:  Recent Labs Lab 07/15/2015 1840  NA 130*  K 5.6*  CL 100*  CO2 14*  GLUCOSE 121*  BUN 47*  CREATININE 1.45*  CALCIUM 7.3*   Liver Function Tests:  Recent Labs Lab 08/03/2015 1840  AST 98*  ALT 32  ALKPHOS 209*  BILITOT 3.0*  PROT 4.7*  ALBUMIN 2.0*   No results for input(s): LIPASE, AMYLASE in the last 168 hours. No results for input(s): AMMONIA in the last 168 hours. CBC:  Recent Labs Lab 08/06/2015 1840  WBC 11.3*  NEUTROABS 10.1*  HGB 11.5*  HCT 35.6*  MCV 94.9  PLT 80*   Cardiac Enzymes: No results for input(s): CKTOTAL, CKMB, CKMBINDEX, TROPONINI in the last 168 hours.  BNP (last 3 results) No results for input(s): BNP in the last 8760 hours.  ProBNP (last 3 results) No results for input(s): PROBNP in the last 8760 hours.  CBG: No results for input(s): GLUCAP in the last 168 hours.  Radiological Exams on Admission: Dg Chest Port 1 View  07/20/2015   CLINICAL DATA:  Confusion, history of effusions.  EXAM: PORTABLE CHEST 1 VIEW   COMPARISON:  Chest radiographs 07/1915, chest CT 02/15/2015  FINDINGS: Tip of the right chest port in the mid SVC. Right pleural effusion has minimally increased from prior exam. Opacity throughout the right lower lung zone, may reflect atelectasis, consolidation, or metastatic disease. Pulmonary masses in the left lung, 5.0 and 1.9 cm, grossly unchanged in the short interim. Cardiomediastinal contours are unchanged. There is no pneumothorax.  IMPRESSION: 1. Slight increased size right pleural effusion. 2. Opacity in the right lower lung zone, may reflect atelectasis, consolidation, or metastatic disease likely increased from prior. Pulmonary masses in the left hemithorax, grossly unchanged in the short interim.   Electronically Signed   By: Jeb Levering M.D.   On: 07/17/2015 19:27      Assessment/Plan Active Problems:   Hypertension   Sepsis   Sarcoma   1. 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 HTN -will monitor pressures  3. Recent Sepsis -spoke with patient and son -they do not want antibiotics at this time and want him to be kept comfortable  4. AKI -will hold lasix -IVF will be started  5. Hyperlipidemia -will continue on pravachol     Code Status: full code (must indicate code status--if unknown or must be presumed, indicate so) DVT Prophylaxis:SCD Family Communication: son (indicate person spoken with, if applicable, with phone number if by telephone) Disposition Plan: home (indicate anticipated LOS)    [facility] Pager [phone]

## 2015-08-08 NOTE — ED Notes (Signed)
md at bedside

## 2015-08-08 NOTE — Progress Notes (Signed)
MEDICATION RELATED CONSULT NOTE - INITIAL   Pharmacy Consult for Pazopanib (Votrient) Indication: Cancer  Allergies  Allergen Reactions  . Pork-Derived Products Other (See Comments)    Not an allergy - religious belief to not consume anything pork-derived **Patient and family state it is okay to have medications that alert [place] with the "pork-derived" warning**   . Ativan [Lorazepam] Other (See Comments)    Hallucinations, agitation   . Heparin (Porcine) Other (See Comments)    Patient does not consume pork products per religious practice.    Patient Measurements: Weight: 108 lb 14.5 oz (49.4 kg) Adjusted Body Weight:   Vital Signs: Temp: 98.4 F (36.9 C) (09/27 2223) Temp Source: Oral (09/27 2223) BP: 132/80 mmHg (09/27 2223) Pulse Rate: 123 (09/27 2223) Intake/Output from previous day:   Intake/Output from this shift:    Labs:  Recent Labs  08/05/2015 1840  WBC 11.3*  HGB 11.5*  HCT 35.6*  PLT 80*  CREATININE 1.45*  ALBUMIN 2.0*  PROT 4.7*  AST 98*  ALT 32  ALKPHOS 209*  BILITOT 3.0*   Estimated Creatinine Clearance: 37.9 mL/min (by C-G formula based on Cr of 1.45).   Microbiology: Recent Results (from the past 720 hour(s))  Culture, blood (routine x 2)     Status: None   Collection Time: 07/31/15  5:33 PM  Result Value Ref Range Status   Specimen Description BLOOD LEFT ANTECUBITAL  Final   Special Requests BOTTLES DRAWN AEROBIC AND ANAEROBIC 5 CC EACH  Final   Culture   Final    NO GROWTH 5 DAYS Performed at [facility]    Report Status 08/05/2015 FINAL  Final  Culture, blood (routine x 2)     Status: None   Collection Time: 07/31/15  5:40 PM  Result Value Ref Range Status   Specimen Description RIGHT ANTECUBITAL  Final   Special Requests BOTTLES DRAWN AEROBIC AND ANAEROBIC 5CC  Final   Culture   Final    NO GROWTH 5 DAYS Performed at [facility]    Report Status 08/05/2015 FINAL  Final    Medical History: Past Medical History   Diagnosis Date  . Hypertension   . Cancer     Dr. Kendall Flack at [facility] is H/O; BONE CA-- CHEMO; pleomorphic sarcoma left leg/hip  . Coronary atherosclerosis     per CT 07/13/13 at [facility]  . Cholelithiases     per CT 07/13/13  . Fatty liver     per CT 07/13/13 at [facility]  . Pulmonary nodules     per CT 07/13/13 at [facility]; multiple b/l   . DVT (deep venous thrombosis)     dx 9/18 right IJ and R SCV on Xarelto  . Pleomorphic cell sarcoma     Medications:  Prescriptions prior to admission  Medication Sig Dispense Refill Last Dose  . acetaminophen (TYLENOL) 650 MG CR tablet Take 650 mg by mouth every 4 (four) hours as needed for pain.   Unknown  . albuterol (PROVENTIL HFA;VENTOLIN HFA) 108 (90 BASE) MCG/ACT inhaler Inhale 1-2 puffs into the lungs every 6 (six) hours as needed for wheezing or shortness of breath. 1 Inhaler 0 Past Month at Unknown time  . benzonatate (TESSALON) 100 MG capsule Take 100 mg by mouth 3 (three) times daily as needed for cough.   08/07/2015 at Unknown time  . citalopram (CELEXA) 20 MG tablet Take 20 mg by mouth daily.   08/07/2015 at Unknown time  . dexamethasone (DECADRON) 1 MG  tablet Take 1 mg by mouth daily. For 4 days, starting 9/25.   08/07/2015 at Unknown time  . furosemide (LASIX) 20 MG tablet Take 20 mg by mouth every other day.   08/07/2015 at Unknown time  . levofloxacin (LEVAQUIN) 500 MG tablet Take 1 tablet by mouth daily. For 1 day.  0 Unknown  . ondansetron (ZOFRAN-ODT) 8 MG disintegrating tablet Take 8 mg by mouth every 8 (eight) hours as needed for nausea or vomiting.   08/07/2015 at Unknown time  . OxyCODONE HCl ER 30 MG T12A Take 1 tablet by mouth every 12 (twelve) hours.   08/07/2015 at Unknown time  . pantoprazole (PROTONIX) 40 MG tablet Take 40 mg by mouth daily.   08/07/2015 at Unknown time  . pazopanib (VOTRIENT) 200 MG tablet Take 800 mg by mouth daily. Take on an empty stomach.   08/07/2015 at Unknown time  . pravastatin (PRAVACHOL) 40 MG tablet Take 40 mg by mouth  daily.    08/07/2015 at Unknown time  . prochlorperazine (COMPAZINE) 10 MG tablet Take 10 mg by mouth every 6 (six) hours as needed for nausea or vomiting.   08/07/2015 at Unknown time  . ranitidine (ZANTAC) 150 MG tablet Take 150 mg by mouth 2 (two) times daily.   08/07/2015 at Unknown time  . scopolamine (TRANSDERM-SCOP, 1.5 MG,) 1 MG/3DAYS Place 1 patch onto the skin every 3 (three) days.   08/09/2015 at Unknown time  . sennosides-docusate sodium (SENOKOT-S) 8.6-50 MG tablet Take 1 tablet by mouth daily.   08/07/2015 at Unknown time  . doxycycline (VIBRAMYCIN) 100 MG capsule Take 1 capsule (100 mg total) by mouth 2 (two) times daily. One po bid x 7 days (Patient not taking: Reported on 07/18/2015) 14 capsule 0   . ibuprofen (ADVIL,MOTRIN) 600 MG tablet Take 1 tablet (600 mg total) by mouth every 6 (six) hours as needed. (Patient not taking: Reported on 01/25/2015) 30 tablet 0 Not Taking at Unknown time  . loratadine (CLARITIN) 10 MG tablet Take 1 tablet (10 mg total) by mouth daily. (Patient not taking: Reported on 01/25/2015) 10 tablet 0 Not Taking at Unknown time  . oxyCODONE-acetaminophen (PERCOCET) 10-325 MG per tablet Take 1 tablet by mouth every 4 (four) hours as needed for pain. (Patient not taking: Reported on 02/15/2015) 20 tablet 0   . predniSONE (DELTASONE) 50 MG tablet Take 1 tablet (50 mg total) by mouth daily. 2 tabs po daily x 3 days (Patient not taking: Reported on 01/25/2015) 5 tablet 0 Not Taking at Unknown time   Scheduled:  . [START ON 08/09/2015] citalopram  20 mg Oral Daily  . [START ON 08/09/2015] dexamethasone  1 mg Oral Daily  . [START ON 08/09/2015] famotidine  20 mg Oral Daily  . [START ON 9/45/8592] folic acid  1 mg Oral Daily  . [START ON 08/09/2015] multivitamin with minerals  1 tablet Oral Daily  . [START ON 08/09/2015] pantoprazole  40 mg Oral Daily  . [START ON 08/09/2015] pravastatin  40 mg Oral q1800  . scopolamine  1 patch Transdermal Q72H  . [START ON 08/09/2015] thiamine   100 mg Oral Daily    Assessment: Patient's bili > 2x ULN on lastest labs. (Total Bilirubin 3).  This is hold criteria for pazopanib. Pazopanib (Votrient) . AST > 3x ULN . Bili > 2x ULN . Acute arterial or venous thromboembolic event . Gastrointestinal perforation . Hemorrhage . Infection . New or more frequent seizures or visual disturbances . New or worsened  CHF . Uncontrolled hypertension  Goal of Therapy:  Safe and effective use of pazopanib  Plan:  Hold the medication at this time.   Nani Skillern Crowford 07/27/2015,11:10 PM

## 2015-08-08 NOTE — ED Notes (Addendum)
Per ems, and previous notes from visits. 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 Patient not aware. .Patient is receiving chemotherapy via port a cath and last treatment 9/5.Marland KitchenHe is a left aka and ambulates with crutches.   Pt discharged from [facility] 2 days ago, was in hospital 6 days for nausea/ vomiting as best ems could determined via translator. Vomiting yesterday, nausea today. No PO intake today. Family cause ems today because pt was difficult to arouse, arousable to loud verbal. pt denied pain through translator.  Bilateral upper pitting extremity edema, greater on right.   22g L AC   Upon rn assessment, pt family member reports pt drank a little water yesterday, small amount of water this morning, no food today. Has not urinated today. Pt on multiple times denies pain. Family reports pt has been confused for several weeks. Right arm has pitting edema, family reports arm was swollen when he was discharged from [facility] on Sunday.

## 2015-08-08 NOTE — Progress Notes (Signed)
CSW went to bedside. Family is grieving at bedside. CSW offered support. CSW  informed family to let her know if she could get them a drink or snack at this time. Family declined.  Willette Brace [phone] ED CSW 07/20/2015 10:07 PM

## 2015-08-08 NOTE — ED Notes (Signed)
At this time, pt not DRN, will work on hospice care, no temp foley needed. Unable to obtain 2nd set of blood cultures. md made aware and states we do not need to attempt to get second set of blood cultures.

## 2015-08-08 NOTE — ED Provider Notes (Signed)
CSN: [id number]     Arrival date & time 07/21/2015  1806 History   First MD Initiated Contact with Patient 07/17/2015 1825     Chief Complaint  Patient presents with  . ca pt, weakness      (Consider location/radiation/quality/duration/timing/severity/associated sxs/prior Treatment) Patient is a 59 y.o. male presenting with altered mental status. The history is provided by a relative.  Altered Mental Status Presenting symptoms: confusion   Severity:  Moderate Most recent episode:  2 days ago Episode history:  Continuous Duration:  2 days Timing:  Constant Progression:  Waxing and waning Chronicity:  New Context comment:  Metastatic cancer, recent admission for HCAP Associated symptoms: abdominal pain, decreased appetite, slurred speech and weakness     Past Medical History  Diagnosis Date  . Hypertension   . Cancer     Dr. Kendall [facility]ack at [facility] is H/O; BONE CA-- CHEMO; pleomorphic sarcoma left leg/hip  . Coronary atherosclerosis     per CT 07/13/13 at [facility]  . Cholelithiases     per CT 07/13/13  . Fatty liver     per CT 07/13/13 at [facility]  . Pulmonary nodules     per CT 07/13/13 at [facility]; multiple b/l   . DVT (deep venous thrombosis)     dx 9/18 right IJ and R SCV on Xarelto  . Pleomorphic cell sarcoma    Past Surgical History  Procedure Laterality Date  . Other surgical history      renal stone  . Other surgical history      FNA  . Other surgical history      Bx of left pelvic mass iliac bone   . Leg amputation Left    Family History  Problem Relation Age of Onset  . Cancer Mother   . Diabetes Sister   . Other      grandaughter with immature teratoma   Social History  Substance Use Topics  . Smoking status: Never Smoker   . Smokeless tobacco: Never Used  . Alcohol Use: No    Review of Systems  Constitutional: Positive for appetite change, fatigue and decreased appetite.  Respiratory: Positive for shortness of breath.   Gastrointestinal: Positive for abdominal pain.   Neurological: Positive for weakness.  Psychiatric/Behavioral: Positive for confusion.  All other systems reviewed and are negative.     Allergies  Pork-derived products; Ativan; and Heparin (porcine)  Home Medications   Prior to Admission medications   Medication Sig Start Date End Date Taking? Authorizing Provider  acetaminophen (TYLENOL) 650 MG CR tablet Take 650 mg by mouth every 4 (four) hours as needed for pain.    Historical Provider, MD  albuterol (PROVENTIL HFA;VENTOLIN HFA) 108 (90 BASE) MCG/ACT inhaler Inhale 1-2 puffs into the lungs every 6 (six) hours as needed for wheezing or shortness of breath. 02/17/15   April Palumbo, MD  benzonatate (TESSALON) 100 MG capsule Take 100 mg by mouth 3 (three) times daily as needed for cough.    Historical Provider, MD  citalopram (CELEXA) 20 MG tablet Take 20 mg by mouth daily.    Historical Provider, MD  dexamethasone (DECADRON) 2 MG tablet tome ONE tableta (2 mg) BY MOUTH EVERY DAY por 7 DAYS THEN 1/2 (1mg ) tableta EVERY DAY por 7 DAYS 07/28/15   Historical Provider, MD  dexamethasone (DECADRON) 4 MG tablet Take 4 mg by mouth daily.    Historical Provider, MD  doxycycline (VIBRAMYCIN) 100 MG capsule Take 1 capsule (100 mg total) by mouth 2 (two) times  daily. One po bid x 7 days Patient not taking: Reported on 07/18/2015 02/17/15   April Palumbo, MD  furosemide (LASIX) 20 MG tablet Take 20 mg by mouth every other day.    Historical Provider, MD  ibuprofen (ADVIL,MOTRIN) 600 MG tablet Take 1 tablet (600 mg total) by mouth every 6 (six) hours as needed. Patient not taking: Reported on 01/25/2015 06/20/14   Hoyle Sauer, MD  loratadine (CLARITIN) 10 MG tablet Take 1 tablet (10 mg total) by mouth daily. Patient not taking: Reported on 01/25/2015 1/32/44   Delora Fuel, MD  OxyCODONE HCl ER 30 MG T12A Take 1 tablet by mouth every 12 (twelve) hours.    Historical Provider, MD  oxyCODONE-acetaminophen (PERCOCET) 10-325 MG per tablet Take 1 tablet by  mouth every 4 (four) hours as needed for pain. Patient not taking: Reported on 02/15/2015 01/25/15   Alfonzo Beers, MD  pantoprazole (PROTONIX) 40 MG tablet Take 40 mg by mouth daily.    Historical Provider, MD  pazopanib (VOTRIENT) 200 MG tablet Take 800 mg by mouth daily. Take on an empty stomach.    Historical Provider, MD  pravastatin (PRAVACHOL) 40 MG tablet Take 40 mg by mouth daily.  01/24/15   Historical Provider, MD  predniSONE (DELTASONE) 50 MG tablet Take 1 tablet (50 mg total) by mouth daily. 2 tabs po daily x 3 days Patient not taking: Reported on 01/25/2015 0/10/27   Delora Fuel, MD  prochlorperazine (COMPAZINE) 10 MG tablet Take 10 mg by mouth every 6 (six) hours as needed for nausea or vomiting.    Historical Provider, MD  ranitidine (ZANTAC) 150 MG tablet Take 150 mg by mouth 2 (two) times daily.    Historical Provider, MD  scopolamine (TRANSDERM-SCOP, 1.5 MG,) 1 MG/3DAYS Place 1 patch onto the skin every 3 (three) days. 07/20/15   Historical Provider, MD  sennosides-docusate sodium (SENOKOT-S) 8.6-50 MG tablet Take 1 tablet by mouth daily.    Historical Provider, MD   BP 113/75 mmHg  Pulse 131  Temp(Src) 98.7 F (37.1 C) (Oral)  Resp 12  SpO2 97% Physical Exam  Constitutional: He is oriented to person, place, and time. He appears lethargic. He appears toxic.  HENT:  Head: Normocephalic and atraumatic.  Eyes: Conjunctivae are normal.  Neck: Neck supple. No tracheal deviation present.  Cardiovascular: Regular rhythm and normal heart sounds.  Tachycardia present.   Pulmonary/Chest: No respiratory distress. He has decreased breath sounds (on right). He has rhonchi.  Abdominal: Soft. He exhibits distension. There is tenderness (diffuse).  Neurological: He is oriented to person, place, and time. He appears lethargic. He displays atrophy. No cranial nerve deficit. GCS eye subscore is 4. GCS verbal subscore is 5. GCS motor subscore is 6.  Skin: Skin is warm and dry.  Psychiatric: His  speech is delayed. He is slowed and withdrawn.    ED Course  Procedures (including critical care time) Labs Review Labs Reviewed  COMPREHENSIVE METABOLIC PANEL - Abnormal; Notable for the following:    Sodium 130 (*)    Potassium 5.6 (*)    Chloride 100 (*)    CO2 14 (*)    Glucose, Bld 121 (*)    BUN 47 (*)    Creatinine, Ser 1.45 (*)    Calcium 7.3 (*)    Total Protein 4.7 (*)    Albumin 2.0 (*)    AST 98 (*)    Alkaline Phosphatase 209 (*)    Total Bilirubin 3.0 (*)    GFR calc  non Af Amer 51 (*)    GFR calc Af Amer 59 (*)    Anion gap 16 (*)    All other components within normal limits  CBC WITH DIFFERENTIAL/PLATELET - Abnormal; Notable for the following:    WBC 11.3 (*)    RBC 3.75 (*)    Hemoglobin 11.5 (*)    HCT 35.6 (*)    RDW 19.5 (*)    Platelets 80 (*)    Neutro Abs 10.1 (*)    Lymphs Abs 0.6 (*)    All other components within normal limits  I-STAT CG4 LACTIC ACID, ED - Abnormal; Notable for the following:    Lactic Acid, Venous 7.35 (*)    All other components within normal limits  CULTURE, BLOOD (ROUTINE X 2)  CULTURE, BLOOD (ROUTINE X 2)  URINALYSIS, ROUTINE W REFLEX MICROSCOPIC (NOT AT [facility])    Imaging Review Dg Chest Port 1 View  07/27/2015   CLINICAL DATA:  Confusion, history of effusions.  EXAM: PORTABLE CHEST 1 VIEW  COMPARISON:  Chest radiographs 07/1915, chest CT 02/15/2015  FINDINGS: Tip of the right chest port in the mid SVC. Right pleural effusion has minimally increased from prior exam. Opacity throughout the right lower lung zone, may reflect atelectasis, consolidation, or metastatic disease. Pulmonary masses in the left lung, 5.0 and 1.9 cm, grossly unchanged in the short interim. Cardiomediastinal contours are unchanged. There is no pneumothorax.  IMPRESSION: 1. Slight increased size right pleural effusion. 2. Opacity in the right lower lung zone, may reflect atelectasis, consolidation, or metastatic disease likely increased from prior.  Pulmonary masses in the left hemithorax, grossly unchanged in the short interim.   Electronically Signed   By: Jeb Levering M.D.   On: 07/31/2015 19:27   I have personally reviewed and evaluated these images and lab results as part of my medical decision-making.   EKG Interpretation   Date/Time:  Tuesday August 08 2015 18:21:21 EDT Ventricular Rate:  127 PR Interval:  118 QRS Duration: 85 QT Interval:  344 QTC Calculation: 500 R Axis:   75 Text Interpretation:  Sinus tachycardia Low voltage, extremity leads  Borderline prolonged QT interval Confirmed by KNOTT MD, Quillian Quince ([id number]) on  07/15/2015 7:20:46 PM      MDM   Final diagnoses:  Metastatic sarcoma  Severe sepsis  HCAP (healthcare-associated pneumonia)  Lactic acidosis  Hyperkalemia  Admission for hospice care  AKI (acute kidney injury)    59 year old male with history of sarcoma that is metastatic and worsening over the last admission to [facility] discharged 2 days ago presents with decreasing appetite, increased pain, increased confusion, tachycardia, and lethargy. He is minimally interactive but able to speak in short sentences. He is disoriented. I spoke with the patient's son regarding his condition after learning his lactate was elevated to 7 and after discussing options of readmission to hematology oncology at [facility] or pursuing palliative care course with hospice the son elected to make the patient a full DO NOT RESUSCITATE from his previous full scope of treatment status and social work was consulted for assistance with placement to hospice facility. Fluids administered for tachycardia and likely severe dehydration, we will continue limited workup to evaluate for reversible causes of discomfort.  After establishing the extent of the patient's disease with acute worsening I held a family discussion witnessed by nursing where family and patient all agreed to no further medical or procedural intervention. We will admit  to hospitalist service for palliative care consultation and home hospice  versus hospice house disposition.  Leo Grosser, MD 08/09/15 [phone]

## 2015-08-09 DIAGNOSIS — R531 Weakness: Secondary | ICD-10-CM

## 2015-08-09 DIAGNOSIS — Z7189 Other specified counseling: Secondary | ICD-10-CM

## 2015-08-09 DIAGNOSIS — C499 Malignant neoplasm of connective and soft tissue, unspecified: Secondary | ICD-10-CM

## 2015-08-09 DIAGNOSIS — I10 Essential (primary) hypertension: Secondary | ICD-10-CM

## 2015-08-09 DIAGNOSIS — G893 Neoplasm related pain (acute) (chronic): Secondary | ICD-10-CM

## 2015-08-09 DIAGNOSIS — Z515 Encounter for palliative care: Secondary | ICD-10-CM

## 2015-08-09 LAB — COMPREHENSIVE METABOLIC PANEL
ALBUMIN: 1.8 g/dL — AB (ref 3.5–5.0)
ALT: 56 U/L (ref 17–63)
ANION GAP: 16 — AB (ref 5–15)
AST: 195 U/L — ABNORMAL HIGH (ref 15–41)
Alkaline Phosphatase: 228 U/L — ABNORMAL HIGH (ref 38–126)
BUN: 59 mg/dL — ABNORMAL HIGH (ref 6–20)
CALCIUM: 6.9 mg/dL — AB (ref 8.9–10.3)
CHLORIDE: 100 mmol/L — AB (ref 101–111)
CO2: 16 mmol/L — AB (ref 22–32)
Creatinine, Ser: 1.68 mg/dL — ABNORMAL HIGH (ref 0.61–1.24)
GFR calc Af Amer: 49 mL/min — ABNORMAL LOW (ref >60)
GFR calc non Af Amer: 43 mL/min — ABNORMAL LOW (ref >60)
GLUCOSE: 181 mg/dL — AB (ref 65–99)
POTASSIUM: 5 mmol/L (ref 3.5–5.1)
SODIUM: 132 mmol/L — AB (ref 135–145)
Total Bilirubin: 3.5 mg/dL — ABNORMAL HIGH (ref 0.3–1.2)
Total Protein: 4.4 g/dL — ABNORMAL LOW (ref 6.5–8.1)

## 2015-08-09 LAB — CBC
HEMATOCRIT: 34 % — AB (ref 39.0–52.0)
HEMOGLOBIN: 11.2 g/dL — AB (ref 13.0–17.0)
MCH: 30.9 pg (ref 26.0–34.0)
MCHC: 32.9 g/dL (ref 30.0–36.0)
MCV: 93.7 fL (ref 78.0–100.0)
Platelets: 47 10*3/uL — ABNORMAL LOW (ref 150–400)
RBC: 3.63 MIL/uL — AB (ref 4.22–5.81)
RDW: 19.5 % — ABNORMAL HIGH (ref 11.5–15.5)
WBC: 12.1 10*3/uL — ABNORMAL HIGH (ref 4.0–10.5)

## 2015-08-09 LAB — TSH: TSH: 1.506 u[IU]/mL (ref 0.350–4.500)

## 2015-08-09 MED ORDER — SODIUM CHLORIDE 0.9 % IJ SOLN
10.0000 mL | Freq: Two times a day (BID) | INTRAMUSCULAR | Status: DC
  Administered 2015-08-09: 10 mL

## 2015-08-09 MED ORDER — SODIUM CHLORIDE 0.9 % IJ SOLN: 10.0000 mL | INTRAMUSCULAR | Status: DC | PRN

## 2015-08-09 MED ORDER — MORPHINE SULFATE (PF) 2 MG/ML IV SOLN
2.0000 mg | INTRAVENOUS | Status: DC | PRN
  Administered 2015-08-09 – 2015-08-10 (×5): 2 mg via INTRAVENOUS
  Filled 2015-08-09 (×5): qty 1

## 2015-08-09 NOTE — Consult Note (Signed)
Consultation Note Date: 08/09/2015   Patient Name: Gregory Frazier  DOB: [birth year]  MRN: [id number]  Age / Sex: 59 y.o., male   PCP: Harun Brumley Not In System Referring Physician: Debbe Odea, MD  Reason for Consultation: Disposition, Establishing goals of care, Non pain symptom management, Pain control and Psychosocial/spiritual support  Palliative Care Assessment and Plan Summary of Established Goals of Care and Medical Treatment Preferences    Palliative Care Discussion Held Today:    This NP Wadie Lessen reviewed medical records, received report from team, assessed the patient and then meet at the patient's bedside along with family to include son/Demetries, wife and many other family members, along with  Spansih interpretor  to discuss diagnosis prognosis, [place], EOL wishes disposition and options.  A detailed discussion was had today regarding advanced directives.  Concepts specific to code status, artifical feeding and hydration, continued IV antibiotics and rehospitalization was had.  The difference between a aggressive medical intervention path  and a palliative comfort care path for this patient at this time was had.  Values and goals of care important to patient and family were attempted to be elicited.  Concept of Hospice and Palliative Care were discussed  Natural trajectory and expectations at EOL were discussed.  Questions and concerns addressed. Family encouraged to call with questions or concerns.  PMT will continue to support holistically.    Primary Decision Maker: family as a whole  Goals of Care/Code Status/Advance Care Planning:   Code Status: DNR/DNI  Focus is comfort quality and dignity.  No further life prolonging interventions.  Family express desire for comfort  Symptom Management:    Pain/Dyspnea: Morphine 2 mg IV every 1 hr prn  Psycho-social/Spiritual:   Support System: large family  Desire for further Chaplaincy support:no  Prognosis: < 2  weeks  Discharge Planning:  Hospice facility       Chief Complaint: Shortness of breath  History of Present Illness:  59 y.o. male with history of HTN metastatic sarcoma presents to the ED with increased weakness.  Patient does not speak english. He was seen in [place] on September 16 in the sarcoma clinic with history of pain in the hips. Patient had been found to have large lytic lesions in the left hip.  Patient had also noted to have increased weakness. Patient was found to have lesions involving the lung which in May was solitary and when he returned from [place] in July he now had multiple metastatic lesions.  Patient was started on chemo therapy in 2015 at [facility] with 9 cycles through Feb 2016. Patient still however had disease progression noted. Currently he has extensive involvement of the abdomen and chest. Patient was recently treated for sepsis. Patient now presents with increased weakness.   Family is faced with advanced directive decisions and anticipatory care needs  Primary Diagnoses  Present on Admission:  . Hypertension . Sarcoma . Sepsis  Palliative Review of Systems:    -unable to illicit due to lethargy   I have reviewed the medical record, interviewed the patient and family, and examined the patient. The following aspects are pertinent.  Past Medical History  Diagnosis Date  . Hypertension   . Cancer     Dr. Kendall Flack at [facility] is H/O; BONE CA-- CHEMO; pleomorphic sarcoma left leg/hip  . Coronary atherosclerosis     per CT 07/13/13 at [facility]  . Cholelithiases     per CT 07/13/13  . Fatty liver     per CT 07/13/13 at [facility]  .  Pulmonary nodules     per CT 07/13/13 at [facility]; multiple b/l   . DVT (deep venous thrombosis)     dx 9/18 right IJ and R SCV on Xarelto  . Pleomorphic cell sarcoma    Social History   Social History  . Marital Status: Married    Spouse Name: N/A  . Number of Children: N/A  . Years of Education: N/A   Social History Main Topics  .  Smoking status: Never Smoker   . Smokeless tobacco: Never Used  . Alcohol Use: No  . Drug Use: No  . Sexual Activity: Yes   Other Topics Concern  . None   Social History Narrative   Family History  Problem Relation Age of Onset  . Cancer Mother   . Diabetes Sister   . Other      grandaughter with immature teratoma   Scheduled Meds: . citalopram  20 mg Oral Daily  . dexamethasone  1 mg Oral Daily  . famotidine  20 mg Oral Daily  . feeding supplement (ENSURE ENLIVE)  237 mL Oral BID BM  . folic acid  1 mg Oral Daily  . multivitamin with minerals  1 tablet Oral Daily  . pantoprazole  40 mg Oral Daily  . pravastatin  40 mg Oral q1800  . scopolamine  1 patch Transdermal Q72H  . thiamine  100 mg Oral Daily   Continuous Infusions:  PRN Meds:.acetaminophen **OR** acetaminophen, albuterol, benzonatate, morphine injection, ondansetron **OR** ondansetron (ZOFRAN) IV, oxyCODONE, polyethylene glycol Medications Prior to Admission:  Prior to Admission medications   Medication Sig Start Date End Date Taking? Authorizing Chudney Scheffler  acetaminophen (TYLENOL) 650 MG CR tablet Take 650 mg by mouth every 4 (four) hours as needed for pain.   Yes Historical Shawntelle Ungar, MD  albuterol (PROVENTIL HFA;VENTOLIN HFA) 108 (90 BASE) MCG/ACT inhaler Inhale 1-2 puffs into the lungs every 6 (six) hours as needed for wheezing or shortness of breath. 02/17/15  Yes April Palumbo, MD  benzonatate (TESSALON) 100 MG capsule Take 100 mg by mouth 3 (three) times daily as needed for cough.   Yes Historical Jennie Bolar, MD  citalopram (CELEXA) 20 MG tablet Take 20 mg by mouth daily.   Yes Historical Rella Egelston, MD  dexamethasone (DECADRON) 1 MG tablet Take 1 mg by mouth daily. For 4 days, starting 9/25.   Yes Historical Navil Kole, MD  furosemide (LASIX) 20 MG tablet Take 20 mg by mouth every other day.   Yes Historical Ara Grandmaison, MD  levofloxacin (LEVAQUIN) 500 MG tablet Take 1 tablet by mouth daily. For 1 day. 08/07/15  Yes  Historical Havah Ammon, MD  ondansetron (ZOFRAN-ODT) 8 MG disintegrating tablet Take 8 mg by mouth every 8 (eight) hours as needed for nausea or vomiting.   Yes Historical Maynard David, MD  OxyCODONE HCl ER 30 MG T12A Take 1 tablet by mouth every 12 (twelve) hours.   Yes Historical Arlette Schaad, MD  pantoprazole (PROTONIX) 40 MG tablet Take 40 mg by mouth daily.   Yes Historical Lama Narayanan, MD  pazopanib (VOTRIENT) 200 MG tablet Take 800 mg by mouth daily. Take on an empty stomach.   Yes Historical Diksha Tagliaferro, MD  pravastatin (PRAVACHOL) 40 MG tablet Take 40 mg by mouth daily.  01/24/15  Yes Historical Jazmine Heckman, MD  prochlorperazine (COMPAZINE) 10 MG tablet Take 10 mg by mouth every 6 (six) hours as needed for nausea or vomiting.   Yes Historical Dylynn Ketner, MD  ranitidine (ZANTAC) 150 MG tablet Take 150 mg by mouth 2 (two) times  daily.   Yes Historical Omaya Nieland, MD  scopolamine (TRANSDERM-SCOP, 1.5 MG,) 1 MG/3DAYS Place 1 patch onto the skin every 3 (three) days. 07/20/15  Yes Historical Mariaelena Cade, MD  sennosides-docusate sodium (SENOKOT-S) 8.6-50 MG tablet Take 1 tablet by mouth daily.   Yes Historical Jawana Reagor, MD  doxycycline (VIBRAMYCIN) 100 MG capsule Take 1 capsule (100 mg total) by mouth 2 (two) times daily. One po bid x 7 days Patient not taking: Reported on 07/18/2015 02/17/15   April Palumbo, MD  ibuprofen (ADVIL,MOTRIN) 600 MG tablet Take 1 tablet (600 mg total) by mouth every 6 (six) hours as needed. Patient not taking: Reported on 01/25/2015 06/20/14   Hoyle Sauer, MD  loratadine (CLARITIN) 10 MG tablet Take 1 tablet (10 mg total) by mouth daily. Patient not taking: Reported on 01/25/2015 1/61/09   Delora Fuel, MD  oxyCODONE-acetaminophen (PERCOCET) 10-325 MG per tablet Take 1 tablet by mouth every 4 (four) hours as needed for pain. Patient not taking: Reported on 02/15/2015 01/25/15   Alfonzo Beers, MD  predniSONE (DELTASONE) 50 MG tablet Take 1 tablet (50 mg total) by mouth daily. 2 tabs po daily x 3  days Patient not taking: Reported on 01/25/2015 [birth year]   Delora Fuel, MD   Allergies  Allergen Reactions  . Pork-Derived Products Other (See Comments)    Not an allergy - religious belief to not consume anything pork-derived **Patient and family state it is okay to have medications that alert [place] with the "pork-derived" warning**   . Ativan [Lorazepam] Other (See Comments)    Hallucinations, agitation   . Heparin (Porcine) Other (See Comments)    Patient does not consume pork products per religious practice.   CBC:    Component Value Date/Time   WBC 11.3* 07/26/2015 1840   HGB 11.5* 08/09/2015 1840   HCT 35.6* 07/22/2015 1840   PLT 80* 08/09/2015 1840   MCV 94.9 08/05/2015 1840   NEUTROABS 10.1* 08/06/2015 1840   LYMPHSABS 0.6* 07/28/2015 1840   MONOABS 0.6 07/30/2015 1840   EOSABS 0.0 07/31/2015 1840   BASOSABS 0.0 08/11/2015 1840   Comprehensive Metabolic Panel:    Component Value Date/Time   NA 130* 07/29/2015 1840   K 5.6* 08/05/2015 1840   CL 100* 07/31/2015 1840   CO2 14* 07/14/2015 1840   BUN 47* 07/20/2015 1840   CREATININE 1.45* 08/06/2015 1840   GLUCOSE 121* 08/05/2015 1840   CALCIUM 7.3* 07/28/2015 1840   AST 98* 07/19/2015 1840   ALT 32 07/21/2015 1840   ALKPHOS 209* 07/26/2015 1840   BILITOT 3.0* 07/15/2015 1840   PROT 4.7* 07/13/2015 1840   ALBUMIN 2.0* 07/18/2015 1840    Physical Exam:  Vital Signs: BP 141/89 mmHg  Pulse 124  Temp(Src) 97.6 F (36.4 C) (Axillary)  Resp 14  Wt 49.4 kg (108 lb 14.5 oz)  SpO2 97% SpO2: SpO2: 97 % O2 Device: O2 Device: Not Delivered O2 Flow Rate:   Intake/output summary: No intake or output data in the 24 hours ending 08/09/15 0927 LBM:   Baseline Weight: Weight: 49.4 kg (108 lb 14.5 oz) Most recent weight: Weight: 49.4 kg (108 lb 14.5 oz)  Exam Findings:   General: ill appearing, NAD HEENT: moist buccal membranes CVS: tachycardic Resp: decreased in bases Abd: soft Extrem: noted left LE amputation Skin:  warm and dry Neuro: letahrgic           Palliative Performance Scale: 30  % at best  Additional Data Reviewed: Recent Labs     07/15/2015  1840  WBC  11.3*  HGB  11.5*  PLT  80*  NA  130*  BUN  47*  CREATININE  1.45*     Time In: 1000 Time Out: 1115 Time Total: 75 min  Greater than 50%  of this time was spent counseling and coordinating care related to the above assessment and plan.  Discussed with  Dr  Wynelle Cleveland  Signed by: Wadie Lessen, NP  Knox Royalty, NP  08/09/2015, 9:27 AM  Please contact Palliative Medicine Team phone at [phone] for questions and concerns.   See AMION for contact information

## 2015-08-09 NOTE — Clinical Social Work Note (Signed)
Clinical Social Work Assessment  Patient Details  Name: Gregory Frazier MRN: [id number] Date of Birth: 10-04-1956  Date of referral:  08/09/15               Reason for consult:  Discharge Planning                Permission sought to share information with:  Family Supports Permission granted to share information::  Yes, Verbal Permission Granted  Name::     Darrin Luis  Agency::     Relationship::  son  Contact Information:  [phone]  Housing/Transportation Living arrangements for the past 2 months:  Mobile Home Source of Information:  Spouse, Adult Children Patient Interpreter Needed:  Spanish Criminal Activity/Legal Involvement Pertinent to Current Situation/Hospitalization:  No - Comment as needed Significant Relationships:  Spouse, Adult Children Lives with:  Spouse Do you feel safe going back to the place where you live?  No Need for family participation in patient care:  Yes (Comment)  Care giving concerns:  Pt approaching end of life and palliative recommending residential hospice.    Social Worker assessment / plan:  CSW received referral for residential hospice.   CSW arranged interpreter to meet with pt family to discuss residential hospice.  CSW met with multiple pt family members of pt at bedside including pt wife and pt son. CSW introduced self and explained role. CSW discussed recommendation for residential hospice and offered choice. Pt family chooses [employer] [facility]. CSW discussed process of referral to [facility]   CSW made referral to [facility], [facility] and [employer] [facility] liaison was able to meet with pt and pt family at that time with interpreter present. CSW received notification from [facility], Erling Conte that pt has bed available at [facility] for tomorrow 9/29.   CSW notified MD and arranged ambulance transport for pt for 9/29 at 10 am.   CSW to continue to follow.  Employment status:  Unemployed Radiation protection practitioner:  Medicaid [place] PT Recommendations:  Not assessed at this time Information / Referral to community resources:  Other (Comment Required) (Residential Hospice)  Patient/Family's Response to care:  Pt unable to participate in assessment. Pt family supportive and surrounding pt at bedside.   Patient/Family's Understanding of and Emotional Response to Diagnosis, Current Treatment, and Prognosis:  Pt family express understanding about pt poor prognosis and recommendation for transfer to [facility] for EOL care.   Emotional Assessment Appearance:  Appears stated age Attitude/Demeanor/Rapport:  Unable to Assess Affect (typically observed):  Unable to Assess Orientation:  Oriented to Self Alcohol / Substance use:  Not Applicable Psych involvement (Current and /or in the community):  No (Comment)  Discharge Needs  Concerns to be addressed:  Discharge Planning Concerns Readmission within the last 30 days:  No Current discharge risk:  Terminally ill Barriers to Discharge:  No Barriers Identified   Mentor, [place], LCSW 08/09/2015, 4:59 PM  [phone]

## 2015-08-09 NOTE — Consult Note (Signed)
[employer]  Received request from [place] for family interest in [facility]. Chart reviewed and met with family with interpreter Evangeline Dakin with [employer]. Family agreeable to transfer to [facility]. They completed paper work with assistance from interpreter. Dr. Orpah Melter to assume care per family request. Family requesting interpreter services at [facility]. Have requested that [place] arrange PTAR pickup at 10:00 so [facility] can arrange for interpreter to arrive at 10:30.   Please fax updated discharge summary to [phone].  RN please call report to [phone].  Thank you.  Erling Conte, [place]

## 2015-08-09 NOTE — Discharge Summary (Addendum)
Physician Discharge Summary  Gregory Frazier GYI:[id number] DOB: [birth year] DOA: 07/14/2015  PCP: PROVIDER NOT IN SYSTEM  Admit date: 08/11/2015 Discharge date: 08-12-2015  Time spent: 50 minutes  Recommendations for Outpatient Follow-up:  1. Will be transitioning to [place] [facility] on 9/29  Discharge Condition: Stable Diet recommendation: As tolerated  Discharge Diagnoses:  Principal Problem:   Metastatic sarcoma Active Problems:   Hypertension   Sepsis   Weakness   History of present illness:  Gregory Frazier is a 59 y.o. male with history of HTN metastatic sarcoma presents to the ED with increased weakness. Patient does not speak english. He was seen in [place] on September 16 in the sarcoma clinic with history of pain in the hips. Patient had been found to have large lytic lesions in the left hip. Patient had also noted to have increased weakness. Patient was found to have lesions involving the lung which in May was solitary and when he returned from [place] in July he now had multiple metastatic lesions. Patient was started on chemo therapy in 2015 at [facility] with 9 cycles through Feb 2016. Patient still however had disease progression noted. Currently he has extensive involvement of the abdomen and chest. Patient was recently treated for sepsis. Patient now presents with increased weakness. At this time he wants hospice care and does not want to proceed with reassessment at [facility]. Dr Ervin Knack spoke to the son directly in [place] and received this information   Hospital Course:  Metastatic sarcoma -At this time would like palliative measures -Has been evaluated by hospice and it has been determined that he is a candidate for [facility] place -Currently no complaints of pain  Acute renal failure with hyponatremia -Likely prerenal due to poor by mouth intake and third spacing of fluids in relation to protein calorie malnutrition-holding Lasix   Consultations:  Palliative  care  Discharge Exam: Filed Weights   07/26/2015 2223  Weight: 49.4 kg (108 lb 14.5 oz)   Filed Vitals:   08/09/15 0434  BP: 141/89  Pulse: 124  Temp: 97.6 F (36.4 C)  Resp: 14    General: Sleeping, does not awaken when I'm trying to speak with him and examine him, no distress Cardiovascular: RRR, no murmurs  Respiratory: clear to auscultation bilaterally GI: soft, non-tender, non-distended, bowel sound positive Extremities: Edema of bilateral upper and lower extremities secondary to anasarca   Discharge Instructions You were cared for by a hospitalist during your hospital stay. If you have any questions about your discharge medications or the care you received while you were in the hospital after you are discharged, you can call the unit and asked to speak with the hospitalist on call if the hospitalist that took care of you is not available. Once you are discharged, your primary care physician will handle any further medical issues. Please note that NO REFILLS for any discharge medications will be authorized once you are discharged, as it is imperative that you return to your primary care physician (or establish a relationship with a primary care physician if you do not have one) for your aftercare needs so that they can reassess your need for medications and monitor your lab values.     Medication List    STOP taking these medications        dexamethasone 1 MG tablet  Commonly known as:  DECADRON     doxycycline 100 MG capsule  Commonly known as:  VIBRAMYCIN     furosemide 20 MG tablet  Commonly known  as:  LASIX     ibuprofen 600 MG tablet  Commonly known as:  ADVIL,MOTRIN     levofloxacin 500 MG tablet  Commonly known as:  LEVAQUIN     loratadine 10 MG tablet  Commonly known as:  CLARITIN     ondansetron 8 MG disintegrating tablet  Commonly known as:  ZOFRAN-ODT     OxyCODONE HCl ER 30 MG T12a     oxyCODONE-acetaminophen 10-325 MG tablet  Commonly known as:   PERCOCET     pantoprazole 40 MG tablet  Commonly known as:  PROTONIX     pravastatin 40 MG tablet  Commonly known as:  PRAVACHOL     predniSONE 50 MG tablet  Commonly known as:  DELTASONE     prochlorperazine 10 MG tablet  Commonly known as:  COMPAZINE     ranitidine 150 MG tablet  Commonly known as:  ZANTAC     TRANSDERM-SCOP (1.5 MG) 1 MG/3DAYS  Generic drug:  scopolamine     VOTRIENT 200 MG tablet  Generic drug:  pazopanib      TAKE these medications        acetaminophen 650 MG CR tablet  Commonly known as:  TYLENOL  Take 650 mg by mouth every 4 (four) hours as needed for pain.     albuterol 108 (90 BASE) MCG/ACT inhaler  Commonly known as:  PROVENTIL HFA;VENTOLIN HFA  Inhale 1-2 puffs into the lungs every 6 (six) hours as needed for wheezing or shortness of breath.     benzonatate 100 MG capsule  Commonly known as:  TESSALON  Take 100 mg by mouth 3 (three) times daily as needed for cough.     citalopram 20 MG tablet  Commonly known as:  CELEXA  Take 20 mg by mouth daily.     sennosides-docusate sodium 8.6-50 MG tablet  Commonly known as:  SENOKOT-S  Take 1 tablet by mouth daily.       Allergies  Allergen Reactions  . Pork-Derived Products Other (See Comments)    Not an allergy - religious belief to not consume anything pork-derived **Patient and family state it is okay to have medications that alert [place] with the "pork-derived" warning**   . Ativan [Lorazepam] Other (See Comments)    Hallucinations, agitation   . Heparin (Porcine) Other (See Comments)    Patient does not consume pork products per religious practice.      The results of significant diagnostics from this hospitalization (including imaging, microbiology, ancillary and laboratory) are listed below for reference.    Significant Diagnostic Studies: Dg Chest 2 View  07/31/2015   CLINICAL DATA:  History of stomach cancer with mets to kidneys and lung. Chronic abdominal pain, more intense  than usual. Chemotherapy, last treatment 2 weeks ago. Productive cough. Shortness of breath.  EXAM: CHEST  2 VIEW  COMPARISON:  02/17/2015  FINDINGS: Right-sided power port tip to the lower superior vena cava. Heart size is normal. There is increased opacity in the right lung consisting of pleural effusion and right lower lobe consolidation. Small amount of right upper lobe infiltrate or atelectasis is also noted. There is increased size of mass within the superior segment of the left lower lobe, now measuring approximately 1.8 cm. Persistent right upper lobe mass the upper is probably stable.  IMPRESSION: 1. Increased right upper lobe and right lower lobe infiltrates and pleural effusion. 2. Increased size of mass within the superior segment left lower lobe.   Electronically Signed   By:  Nolon Nations M.D.   On: 07/31/2015 16:17   Ct Abdomen Pelvis W Contrast  07/18/2015   CLINICAL DATA:  Abdominal pain and cramping. Nausea and vomiting. Elevated white cell count. 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  EXAM: CT ABDOMEN AND PELVIS WITH CONTRAST  TECHNIQUE: Multidetector CT imaging of the abdomen and pelvis was performed using the standard protocol following bolus administration of intravenous contrast.  CONTRAST:  80 mL Omnipaque 300  COMPARISON:  06/19/2014  FINDINGS: New finding of small right pleural effusion with nodular pleural thickening suggesting pleural metastasis. New enlarged lymph nodes in the right cardiophrenic angle consistent with metastasis. Right pulmonary nodules likely also metastatic. Left lung base is clear.  New finding of multiple low-attenuation lesions throughout the liver. Largest is in the inferior liver and measures 9.4 cm in diameter. This is consistent with metastatic disease. Cholelithiasis with contracted gallbladder. No bile duct dilatation. Pancreas, spleen, adrenal glands, abdominal aorta, it and inferior vena cava are unremarkable. Small cysts in the  lower pole left kidney is unchanged since previous study. However, there is interval development of sub cm low-attenuation lesions in both kidneys. The interval development of these lesions suggest probable metastatic disease. Largest is in the upper pole right kidney and measures 1 cm diameter. Stomach, small bowel, and colon are not abnormally distended. Mass lesion in the left lower quadrant small bowel, likely distal jejunum. There is apparent cavitation. This measures about 2.5 x 4.5 cm. This is likely small bowel metastasis or possibly lymphoma. Enlarged lymph nodes in the mesenteric consistent with metastases. No free air or free fluid in the abdomen.  Pelvis: Postoperative changes with prior resection of the left hip and left hemipelvis. There is infiltration and soft tissue thickening along the surgical margin at the left sacrum. This is unchanged since previous study and probably represents postoperative scarring. Bladder wall is not thickened. Calcification in the prostate gland without enlargement. Small amount of free fluid in the pelvis is nonspecific. Appendix is normal. No significant pelvic lymphadenopathy. No destructive bone lesions.  IMPRESSION: Interval development of diffuse metastatic disease. Metastases demonstrated in the right lung base, right pleura, right cardiophrenic angle, liver, mesenteric, small bowel, and probably in both kidneys. Small right pleural effusion is likely to be malignant.   Electronically Signed   By: Lucienne Capers M.D.   On: 07/18/2015 02:40   Dg Chest Port 1 View  07/30/2015   CLINICAL DATA:  Confusion, history of effusions.  EXAM: PORTABLE CHEST 1 VIEW  COMPARISON:  Chest radiographs 07/1915, chest CT 02/15/2015  FINDINGS: Tip of the right chest port in the mid SVC. Right pleural effusion has minimally increased from prior exam. Opacity throughout the right lower lung zone, may reflect atelectasis, consolidation, or metastatic disease. Pulmonary masses in the  left lung, 5.0 and 1.9 cm, grossly unchanged in the short interim. Cardiomediastinal contours are unchanged. There is no pneumothorax.  IMPRESSION: 1. Slight increased size right pleural effusion. 2. Opacity in the right lower lung zone, may reflect atelectasis, consolidation, or metastatic disease likely increased from prior. Pulmonary masses in the left hemithorax, grossly unchanged in the short interim.   Electronically Signed   By: Jeb Levering M.D.   On: 08/01/2015 19:27    Microbiology: Recent Results (from the past 240 hour(s))  Culture, blood (routine x 2)     Status: None   Collection Time: 07/31/15  5:33 PM  Result Value Ref Range Status   Specimen Description BLOOD  LEFT ANTECUBITAL  Final   Special Requests BOTTLES DRAWN AEROBIC AND ANAEROBIC 5 CC EACH  Final   Culture   Final    NO GROWTH 5 DAYS Performed at [facility]    Report Status 08/05/2015 FINAL  Final  Culture, blood (routine x 2)     Status: None   Collection Time: 07/31/15  5:40 PM  Result Value Ref Range Status   Specimen Description RIGHT ANTECUBITAL  Final   Special Requests BOTTLES DRAWN AEROBIC AND ANAEROBIC 5CC  Final   Culture   Final    NO GROWTH 5 DAYS Performed at [facility]    Report Status 08/05/2015 FINAL  Final     Labs: Basic Metabolic Panel:  Recent Labs Lab 08/05/2015 1840 08/09/15 0941  NA 130* 132*  K 5.6* 5.0  CL 100* 100*  CO2 14* 16*  GLUCOSE 121* 181*  BUN 47* 59*  CREATININE 1.45* 1.68*  CALCIUM 7.3* 6.9*   Liver Function Tests:  Recent Labs Lab 08/01/2015 1840 08/09/15 0941  AST 98* 195*  ALT 32 56  ALKPHOS 209* 228*  BILITOT 3.0* 3.5*  PROT 4.7* 4.4*  ALBUMIN 2.0* 1.8*   No results for input(s): LIPASE, AMYLASE in the last 168 hours. No results for input(s): AMMONIA in the last 168 hours. CBC:  Recent Labs Lab 07/21/2015 1840 08/09/15 0941  WBC 11.3* 12.1*  NEUTROABS 10.1*  --   HGB 11.5* 11.2*  HCT 35.6* 34.0*  MCV 94.9 93.7  PLT 80*  47*   Cardiac Enzymes: No results for input(s): CKTOTAL, CKMB, CKMBINDEX, TROPONINI in the last 168 hours. BNP: BNP (last 3 results) No results for input(s): BNP in the last 8760 hours.  ProBNP (last 3 results) No results for input(s): PROBNP in the last 8760 hours.  CBG: No results for input(s): GLUCAP in the last 168 hours.     SignedDebbe Odea, MD [facility] 08/09/2015, 12:34 PM

## 2015-08-10 DIAGNOSIS — N178 Other acute kidney failure: Secondary | ICD-10-CM

## 2015-08-10 MED ORDER — SODIUM CHLORIDE 0.9 % IV SOLN
3.0000 mg/h | INTRAVENOUS | Status: DC
  Administered 2015-08-10: 3 mg/h via INTRAVENOUS
  Filled 2015-08-10: qty 10

## 2015-08-12 NOTE — Discharge Summary (Addendum)
Death Summary  Gregory Frazier FEO:[id number] DOB: 28-Jul-1956 DOA: September 05, 2015  PCP: PROVIDER NOT IN SYSTEM PCP/Office notified:   Admit date: 2015/09/05 Date of Death: 09/07/15  Final Diagnoses:  Principal Problem:   Metastatic sarcoma Active Problems:   Hypertension   Sepsis   Weakness   DNR (do not resuscitate) discussion   Palliative care encounter   Pain, cancer    History of present illness:  59 y.o. male with history of HTN, metastatic sarcoma who presented to [facility] ED 09-05-15 with worsening weakness. He was seen in [place] on September 16 in the sarcoma clinic. Patient had been found to have large lytic lesions in the left hip. Patient  was also found to have lesions involving the lung which initially was soli lesion but later on progressed to multiple lesions. This has further progress to abdomen and chest.   Hospital Course:   Metastatic sarcoma - Comfort care per family wishes - Was on morphine 2 mg IV every 1 hour for comfort  Thrombocytopenia - Due to bone marrow suppression from malignancy - No further blood work  Transaminitis - Likely due to liver metastases   Acute renal failure with hyponatremia - Likely from dehydration - No further blood work as focus on comfort   Code status: DNR/DNI   Time: 8:08 am  09/07/2015  Signed:  Leisa Lenz  [facility] Sep 07, 2015, 9:41 AM

## 2015-08-12 NOTE — Progress Notes (Signed)
Patient found not breathing, pulseless.  Family at bedside.  Nonie Hoyer witnessed death, and wasting of 248 ml's morphine drip.  Dr. Charlies Silvers notified, of patient's death.

## 2015-08-12 NOTE — Progress Notes (Signed)
CSW received notification that pt expired this morning.   CSW notified [facility] liaison, Erling Conte.   No further social work needs identified at this time.  CSW signing off.   Alison Murray, MSW, [place] Work [phone]

## 2015-08-12 NOTE — Progress Notes (Signed)
Spiritual care worked with social work to provide Technical sales engineer of funeral homes for pt's family.   Creta Levin MDid

## 2015-08-12 NOTE — Progress Notes (Signed)
CSW received notification that pt family needed assistance with a list of funeral homes.  CSW obtained spanish interpreter to meet with pt family. List had been provided by chaplain. Appreciate chaplain assistance.  CSW met with pt son with spanish interpreter. Spanish Interpreter assisted with discussing with family options for funeral homes and assisted family in narrowing down to three funeral homes that pt family plans to contact to determine which funeral home pt family would like to use.   Appreciate spanish interpreter assistance.  No further social work needs identified at this time.  CSW signing off.   Alison Murray, MSW, [place] Work [phone]

## 2015-08-12 DEATH — deceased

## 2015-08-13 LAB — CULTURE, BLOOD (ROUTINE X 2): Culture: NO GROWTH

## 2016-02-26 IMAGING — CT CT ANGIO CHEST
1 of 2 series · 19 of 32 positions shown · IV contrast (OMNIPAQUE 350)
Comparison: None.

CLINICAL DATA: Generalize pain for 2 and a have months. Metastatic
sarcoma.

EXAM:
CT ANGIOGRAPHY CHEST WITH CONTRAST
TECHNIQUE: Multidetector CT imaging of the chest was performed using the
standard protocol during bolus administration of intravenous
contrast. Multiplanar CT image reconstructions and MIPs were
obtained to evaluate the vascular anatomy.
CONTRAST:  80mL OMNIPAQUE IOHEXOL 350 MG/ML SOLN

[Series 10: thins for pacs · axial · 0.74mm/px · z∈[+972,+1201]mm · 19 of 255 slices shown]
[im 13/255  lung]
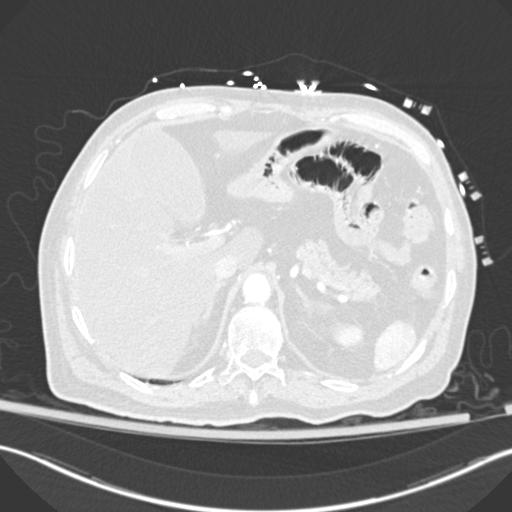
[im 26/255  mediastinal]
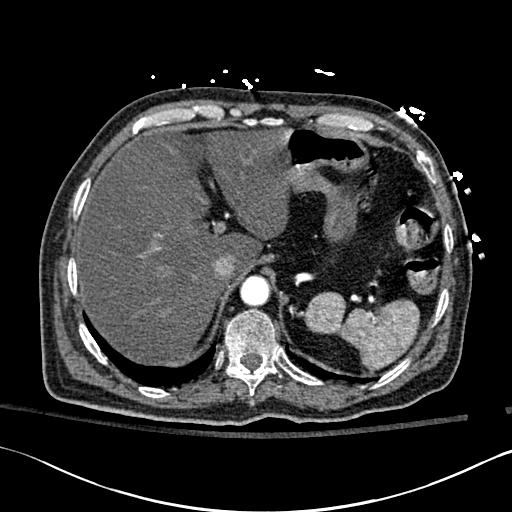
[im 39/255  lung]
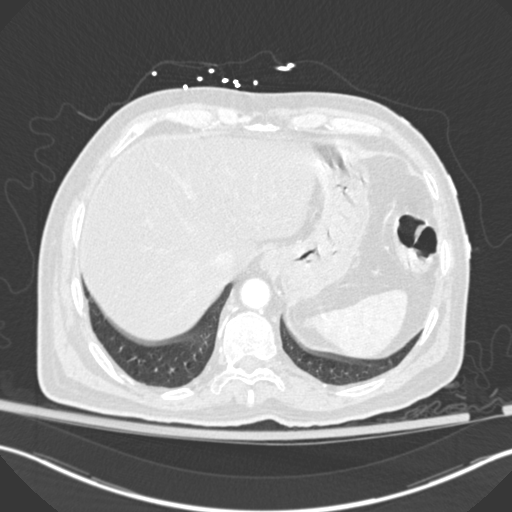
[im 64/255  mediastinal]
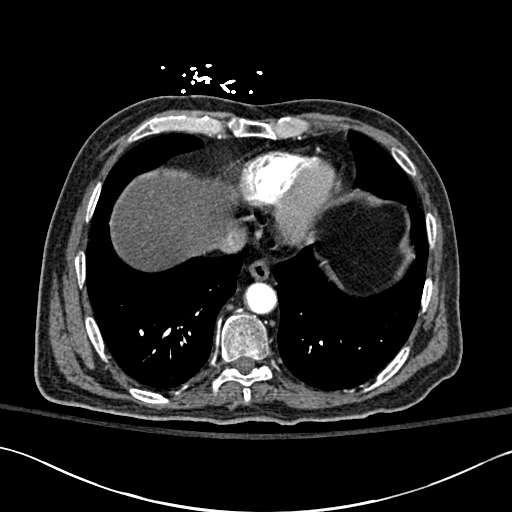
[im 77/255  lung]
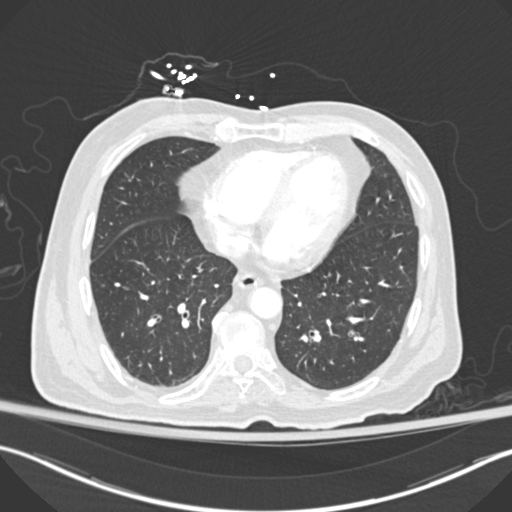
[im 85/255  mediastinal]
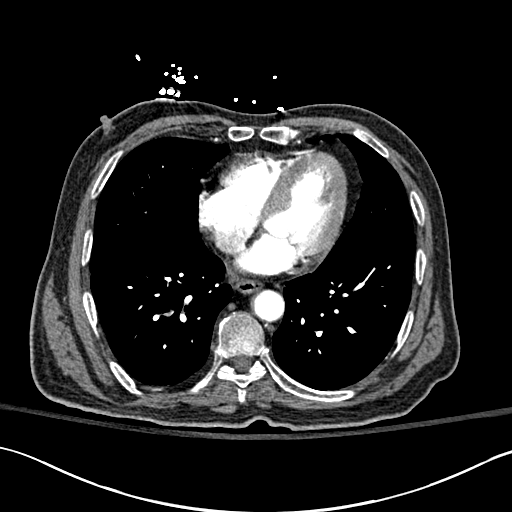
[im 89/255  lung]
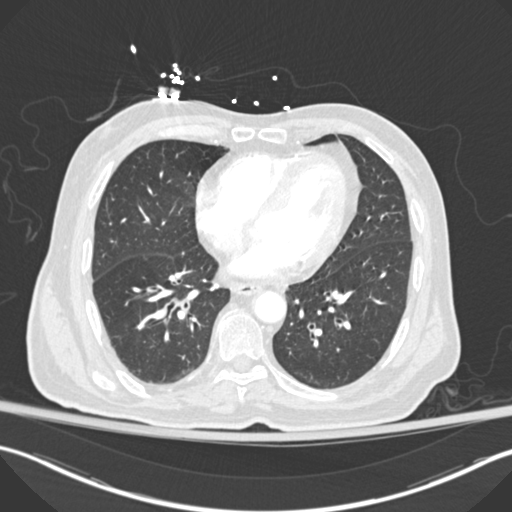
[im 102/255  mediastinal]
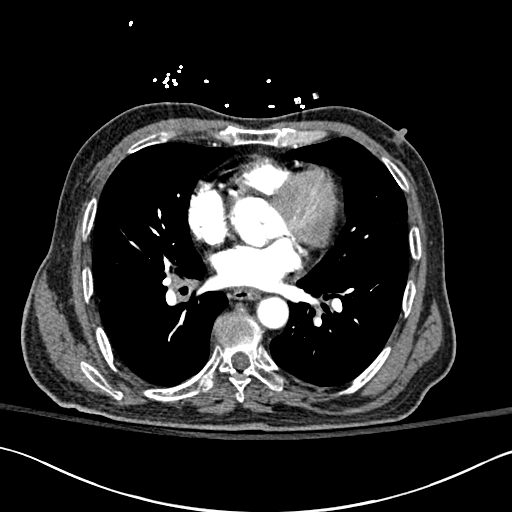
[im 115/255  lung]
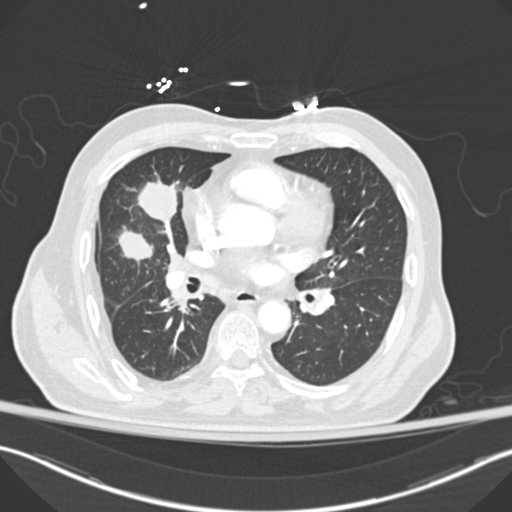
[im 128/255  mediastinal]
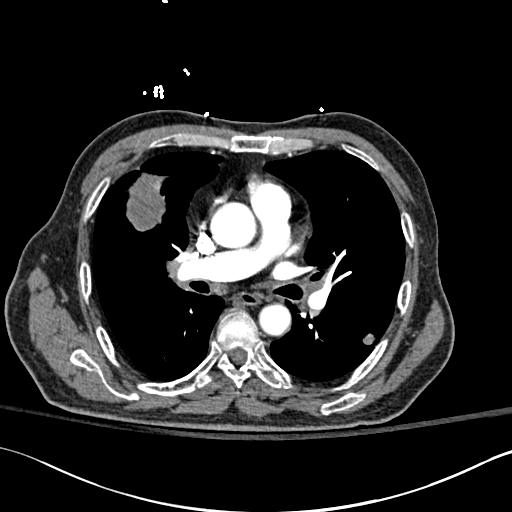
[im 140/255  lung]
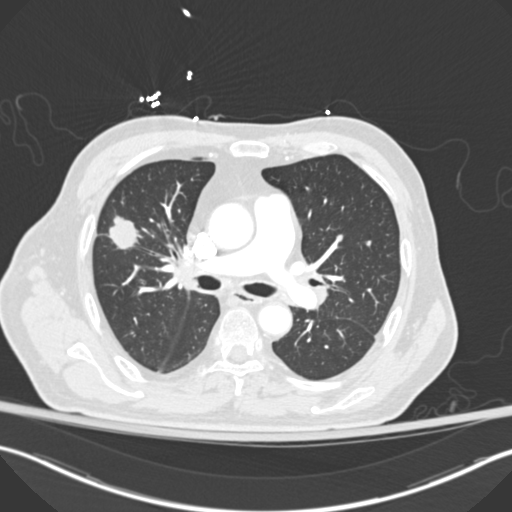
[im 153/255  mediastinal]
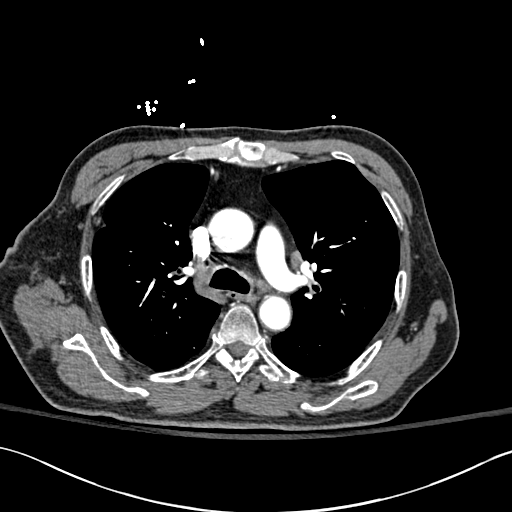
[im 166/255  lung]
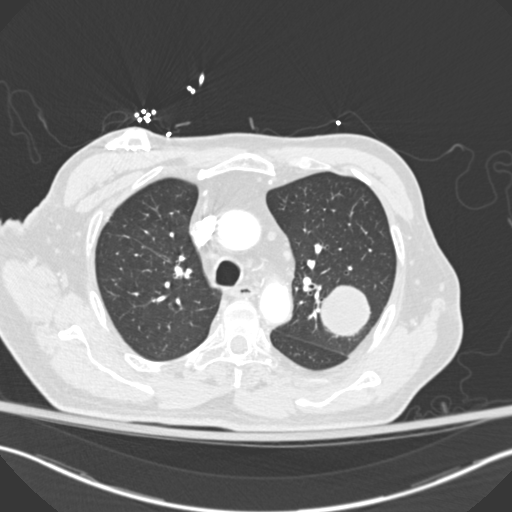
[im 170/255  mediastinal]
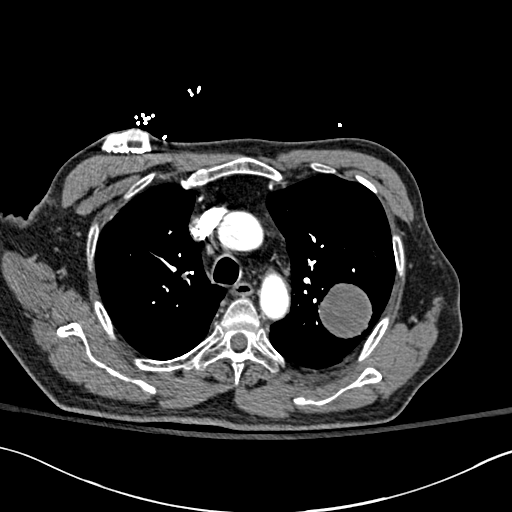
[im 178/255  lung]
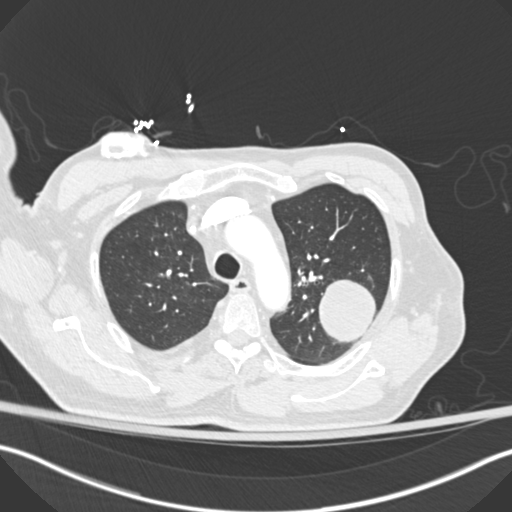
[im 191/255  mediastinal]
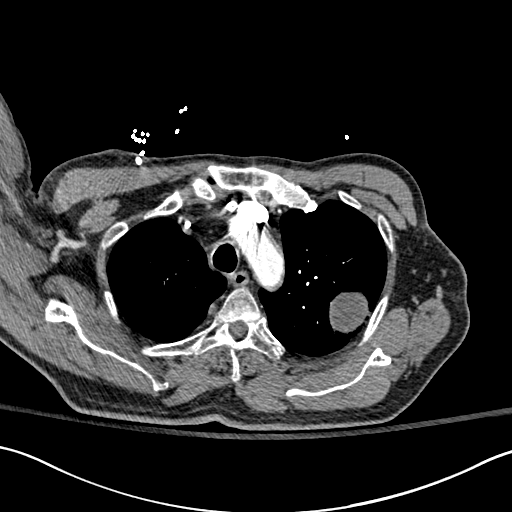
[im 216/255  lung]
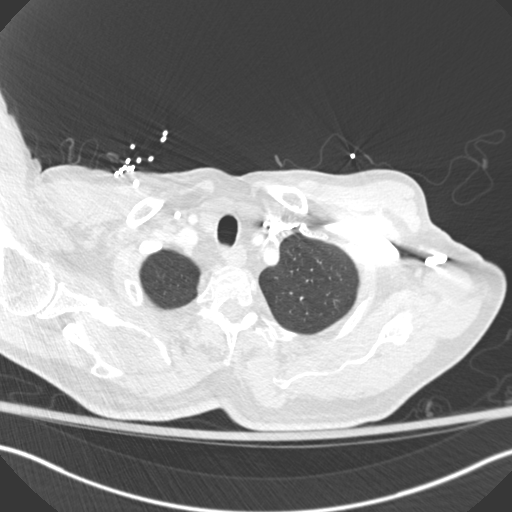
[im 229/255  mediastinal]
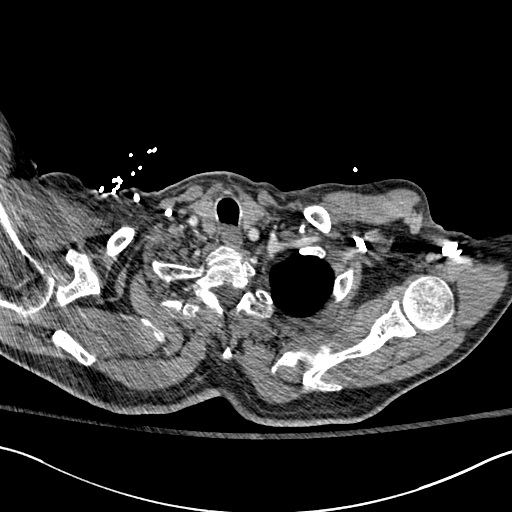
[im 242/255  lung]
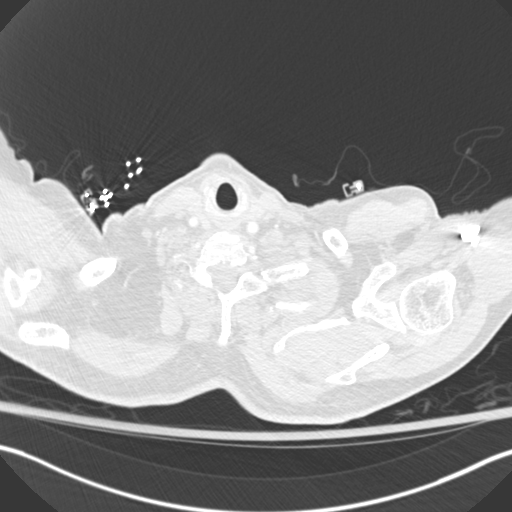

[19 of 32 positions shown; findings below may reference images not displayed]

FINDINGS: Mediastinum: The heart size is normal. There is no pericardial
effusion. The trachea appears patent and is midline. Normal
appearance of the esophagus. Sub- carinal lymph node measures 9 mm,
image 45/series 4. Previously 1.3 cm. Left hilar lymph node measures
8 mm, image 45/series 4. Previously 1 cm. Index left hilar node
measures 1.3 cm, image 48 of series 4. Previously 1.7 cm. The main
pulmonary artery is patent. No lobar or segmental pulmonary artery
filling defects identified to suggest a clinically significant acute
pulmonary embolus.

Lungs/Pleura: There is no pleural effusion noted. Left upper lobe
mass measures 4.1 cm, image 28/ series 11. Previously 3.6 cm. Mass
in the right middle lobe measures 2.7 x 2.5 cm, image 39/series 6.
Previously 1.3 x 1.4 cm. Right upper lobe tumor measures 2.5 cm,
image number 28/ series 6. Previously 2.1 cm.

Upper Abdomen: There is diffuse hepatic steatosis noted. The adrenal
glands appear normal. The visualized portions of the spleen and
pancreas are normal.

Musculoskeletal: No aggressive lytic or sclerotic bone lesion.
Review of the visualized osseous structures is negative for
aggressive lytic or sclerotic bone lesion.

Review of the MIP images confirms the above findings.
IMPRESSION: 1. No evidence for acute pulmonary embolus.
2. Interval progression of bilateral pulmonary metastasis.
3. Decrease in size of mediastinal and bilateral hilar lymph nodes.

## 2016-02-28 IMAGING — CR DG CHEST 2V
2 series · 2 of 2 positions shown · non-contrast
Comparison: 02/15/2015

CLINICAL DATA: Productive cough and fever for 24 hours.

EXAM:
CHEST  2 VIEW

[w chest lat]
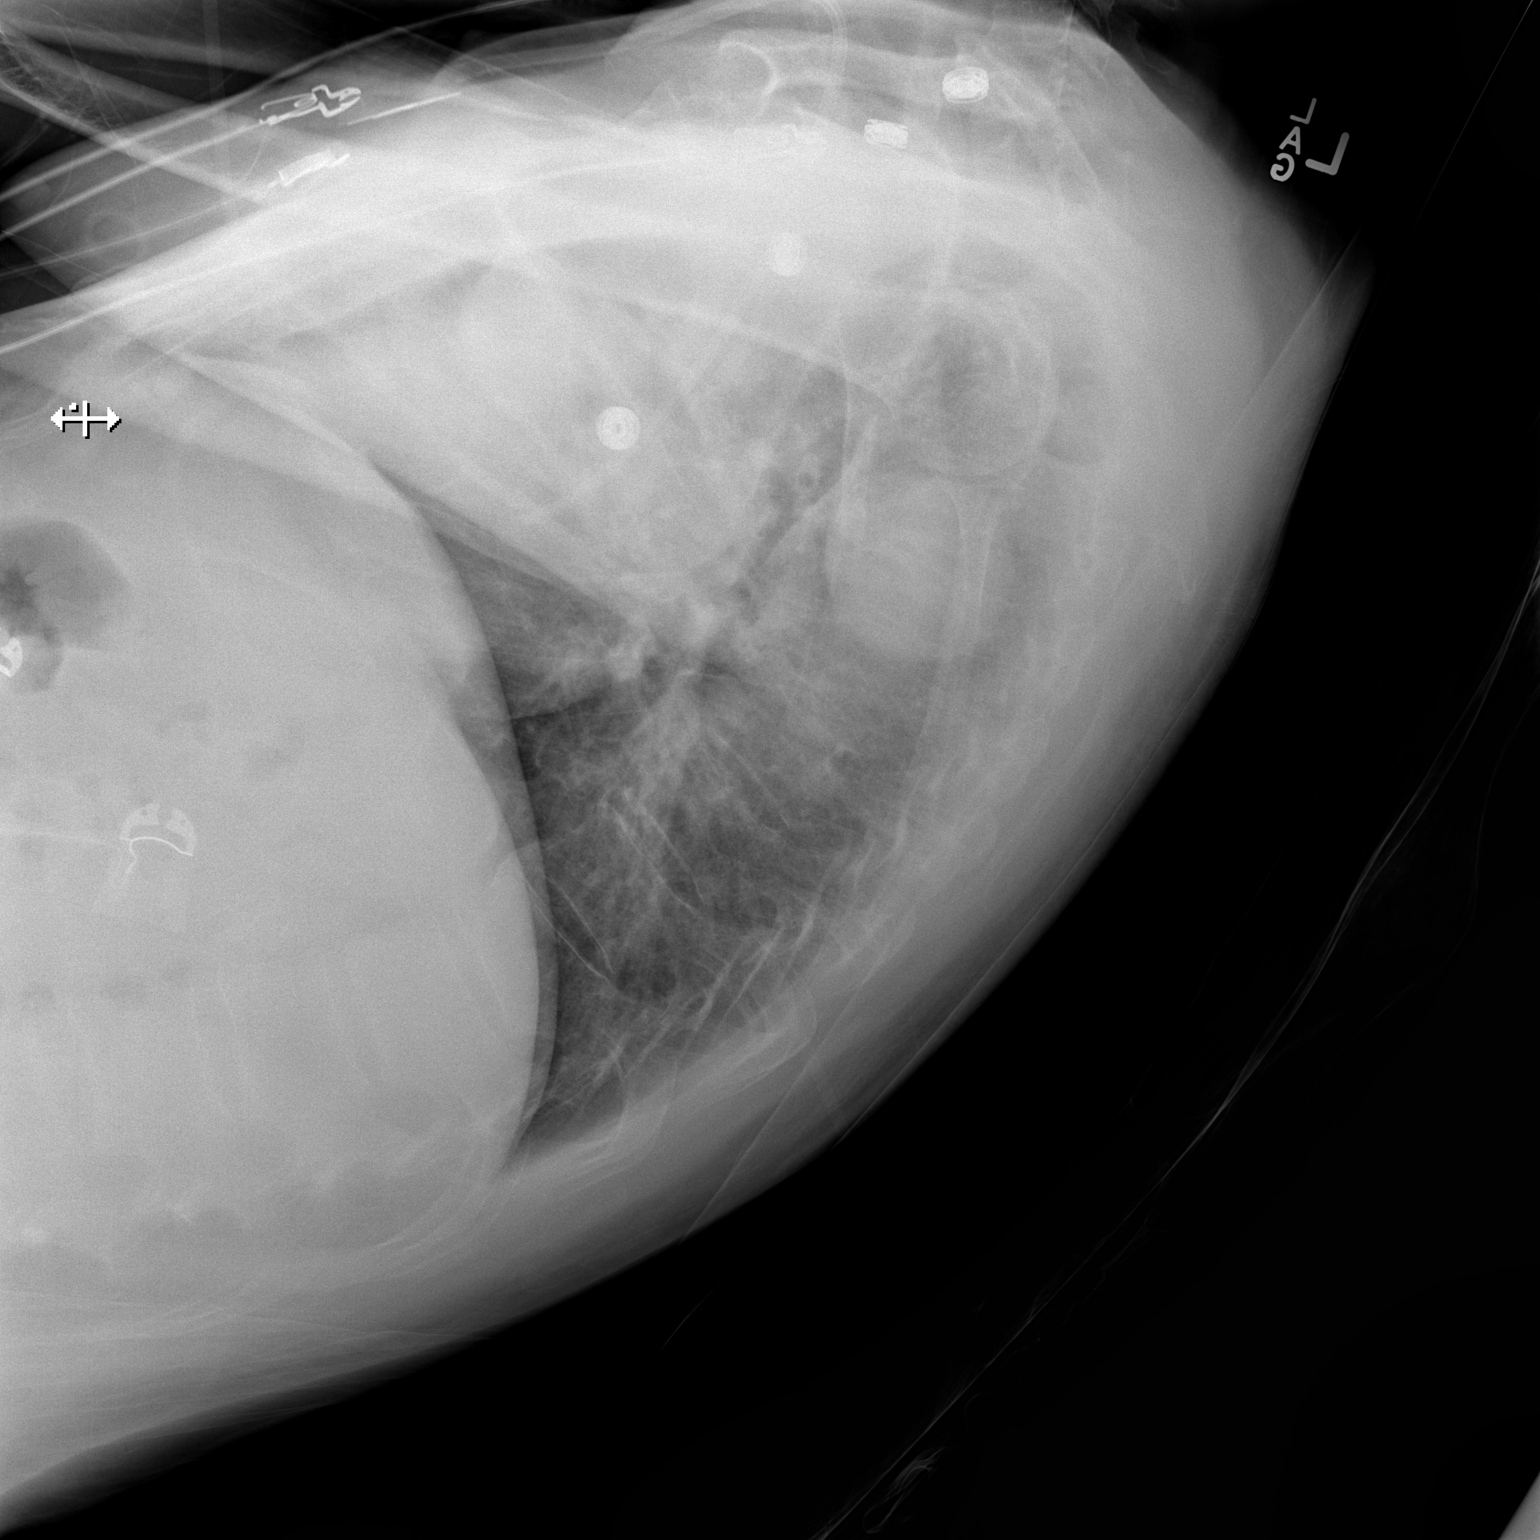

[x chest ap]
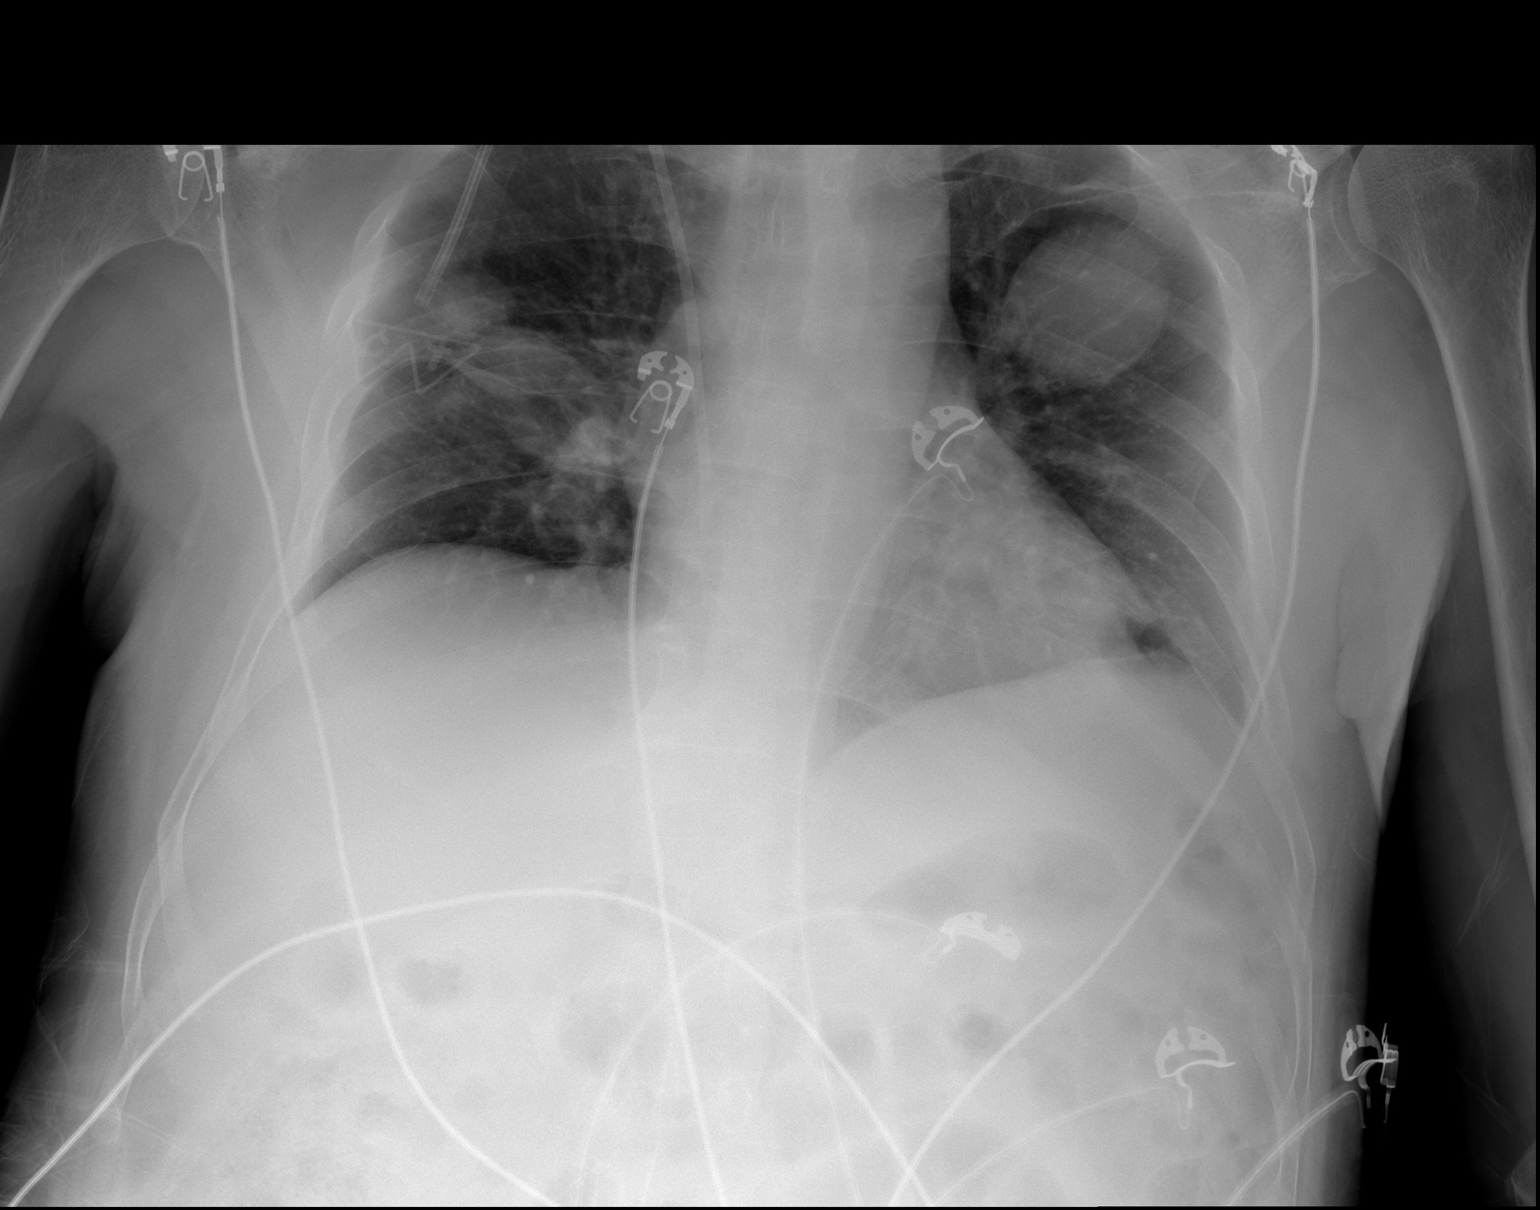

[2 of 2 positions shown; findings below may reference images not displayed]

FINDINGS: Bilateral pulmonary metastases. No change since previous study.
Shallow inspiration. Normal heart size and pulmonary vascularity. No
consolidation or airspace disease in the lungs. No blunting of
costophrenic angles. No pneumothorax. Power port type central venous
catheter remains in place with tip over the cavoatrial junction. No
change since previous study.
IMPRESSION: Bilateral pulmonary metastasis appear stable since previous study.
No evidence of focal consolidation or airspace disease.
